# Patient Record
Sex: Female | Born: 1960 | Race: White | Hispanic: No | Marital: Married | Smoking: Never smoker
Health system: Southern US, Community
[De-identification: ages and names within clinical notes are randomized; demographics above are authoritative.]

## PROBLEM LIST (undated history)

## (undated) DIAGNOSIS — O09529 Supervision of elderly multigravida, unspecified trimester: Secondary | ICD-10-CM

## (undated) DIAGNOSIS — K8020 Calculus of gallbladder without cholecystitis without obstruction: Secondary | ICD-10-CM

## (undated) DIAGNOSIS — K219 Gastro-esophageal reflux disease without esophagitis: Secondary | ICD-10-CM

## (undated) HISTORY — DX: Supervision of elderly multigravida, unspecified trimester: O09.529

## (undated) HISTORY — PX: OTHER SURGICAL HISTORY: SHX169

## (undated) HISTORY — DX: Gastro-esophageal reflux disease without esophagitis: K21.9

---

## 1993-04-18 HISTORY — PX: DILATION AND CURETTAGE OF UTERUS: SHX78

## 1996-04-18 DIAGNOSIS — O09529 Supervision of elderly multigravida, unspecified trimester: Secondary | ICD-10-CM

## 1996-04-18 HISTORY — DX: Supervision of elderly multigravida, unspecified trimester: O09.529

## 1997-07-10 ENCOUNTER — Other Ambulatory Visit: Admission: RE | Admit: 1997-07-10 | Discharge: 1997-07-10 | Payer: Self-pay | Admitting: Obstetrics and Gynecology

## 1998-11-19 ENCOUNTER — Other Ambulatory Visit: Admission: RE | Admit: 1998-11-19 | Discharge: 1998-11-19 | Payer: Self-pay | Admitting: Obstetrics and Gynecology

## 1998-12-15 ENCOUNTER — Encounter: Payer: Self-pay | Admitting: Obstetrics and Gynecology

## 1998-12-15 ENCOUNTER — Ambulatory Visit (HOSPITAL_COMMUNITY): Admission: RE | Admit: 1998-12-15 | Discharge: 1998-12-15 | Payer: Self-pay | Admitting: Obstetrics and Gynecology

## 1999-05-12 ENCOUNTER — Inpatient Hospital Stay (HOSPITAL_COMMUNITY): Admission: AD | Admit: 1999-05-12 | Discharge: 1999-05-14 | Payer: Self-pay | Admitting: Obstetrics and Gynecology

## 1999-05-25 ENCOUNTER — Encounter: Admission: RE | Admit: 1999-05-25 | Discharge: 1999-08-23 | Payer: Self-pay | Admitting: Obstetrics and Gynecology

## 1999-10-26 ENCOUNTER — Other Ambulatory Visit: Admission: RE | Admit: 1999-10-26 | Discharge: 1999-10-26 | Payer: Self-pay | Admitting: Obstetrics and Gynecology

## 2000-12-12 ENCOUNTER — Other Ambulatory Visit: Admission: RE | Admit: 2000-12-12 | Discharge: 2000-12-12 | Payer: Self-pay | Admitting: Obstetrics and Gynecology

## 2002-09-11 ENCOUNTER — Other Ambulatory Visit: Admission: RE | Admit: 2002-09-11 | Discharge: 2002-09-11 | Payer: Self-pay | Admitting: Obstetrics and Gynecology

## 2002-12-18 ENCOUNTER — Ambulatory Visit (HOSPITAL_COMMUNITY): Admission: RE | Admit: 2002-12-18 | Discharge: 2002-12-18 | Payer: Self-pay | Admitting: Obstetrics and Gynecology

## 2002-12-18 ENCOUNTER — Encounter: Payer: Self-pay | Admitting: Obstetrics and Gynecology

## 2004-02-03 ENCOUNTER — Ambulatory Visit (HOSPITAL_COMMUNITY): Admission: RE | Admit: 2004-02-03 | Discharge: 2004-02-03 | Payer: Self-pay | Admitting: Obstetrics and Gynecology

## 2004-06-07 ENCOUNTER — Other Ambulatory Visit: Admission: RE | Admit: 2004-06-07 | Discharge: 2004-06-07 | Payer: Self-pay | Admitting: Obstetrics and Gynecology

## 2005-03-17 ENCOUNTER — Ambulatory Visit (HOSPITAL_COMMUNITY): Admission: RE | Admit: 2005-03-17 | Discharge: 2005-03-17 | Payer: Self-pay | Admitting: Obstetrics and Gynecology

## 2005-07-05 ENCOUNTER — Other Ambulatory Visit: Admission: RE | Admit: 2005-07-05 | Discharge: 2005-07-05 | Payer: Self-pay | Admitting: Obstetrics and Gynecology

## 2006-04-05 ENCOUNTER — Ambulatory Visit (HOSPITAL_COMMUNITY): Admission: RE | Admit: 2006-04-05 | Discharge: 2006-04-05 | Payer: Self-pay | Admitting: Obstetrics and Gynecology

## 2007-05-03 ENCOUNTER — Ambulatory Visit (HOSPITAL_COMMUNITY): Admission: RE | Admit: 2007-05-03 | Discharge: 2007-05-03 | Payer: Self-pay | Admitting: Obstetrics and Gynecology

## 2008-05-09 ENCOUNTER — Ambulatory Visit (HOSPITAL_COMMUNITY): Admission: RE | Admit: 2008-05-09 | Discharge: 2008-05-09 | Payer: Self-pay | Admitting: Obstetrics and Gynecology

## 2009-05-13 ENCOUNTER — Ambulatory Visit (HOSPITAL_COMMUNITY): Admission: RE | Admit: 2009-05-13 | Discharge: 2009-05-13 | Payer: Self-pay | Admitting: Obstetrics and Gynecology

## 2010-06-02 ENCOUNTER — Other Ambulatory Visit (HOSPITAL_COMMUNITY): Payer: Self-pay | Admitting: Obstetrics and Gynecology

## 2010-06-02 DIAGNOSIS — Z1231 Encounter for screening mammogram for malignant neoplasm of breast: Secondary | ICD-10-CM

## 2010-06-15 ENCOUNTER — Ambulatory Visit (HOSPITAL_COMMUNITY)
Admission: RE | Admit: 2010-06-15 | Discharge: 2010-06-15 | Disposition: A | Discharge status: Home / Self Care | Payer: BC Managed Care – PPO | Source: Ambulatory Visit | Attending: Obstetrics and Gynecology | Admitting: Obstetrics and Gynecology

## 2010-06-15 ENCOUNTER — Encounter (HOSPITAL_COMMUNITY): Payer: Self-pay

## 2010-06-15 DIAGNOSIS — Z1231 Encounter for screening mammogram for malignant neoplasm of breast: Secondary | ICD-10-CM | POA: Insufficient documentation

## 2010-07-15 ENCOUNTER — Ambulatory Visit (HOSPITAL_BASED_OUTPATIENT_CLINIC_OR_DEPARTMENT_OTHER): Admission: RE | Admit: 2010-07-15 | Payer: BC Managed Care – PPO | Source: Ambulatory Visit | Admitting: Plastic Surgery

## 2010-09-03 NOTE — H&P (Signed)
[facility]  Patient:    Tara Brooks                    MRN: [id number] Adm. Date:  [id number] Attending:  Leonard Schwartz Dictator:   Mack Guise, C.N.M.                         History and Physical  HISTORY OF PRESENT ILLNESS:   Tara Brooks is a 50 year old, Gravida 3, Para 2-0-1-2 at 38-5/7 weeks by dates, [facility] May 21, 1999 who presents for induction of labor due to history of rapid labor with subsequent delivery of her last child at home.  There are no contractions at present. Positive fetal movement. No bleeding.  No fluid leaking from the vagina.  She denies any headache, visual changes or epigastric pain. Her pregnancy has been followed by the [facility] Service t [facility] and is remarkable for:  (1) Advanced material age, declined amniocentesis. (2) History of rapid labor.  (3) Group B Strep negative.  PRENATAL LAB WORK:             On November 19, 1998 hemoglobin and hematocrit 12.1 and 35.9. Platelets 276,000.  Blood type and Rh A positive.  Antibody screen negative. VDRL nonreactive.  Rubella immune. Hepatitis B surface antigen negative.  Pap smear within normal limits.  GC and Chlamydia negative.  On November 19, 1998 AFP/free beta HCG testing within normal range.  On February 17, 1999 at 28 weeks, one hour glucose challenge within normal limits and hemoglobin 11.9.  At 36 weeks culture of the  vaginal tract is negative for group B Strep.  HISTORY OF PRESENT PREGNANCY:                    This patient was initially evaluated at the office of [facility] on November 19, 1998 at approximately [redacted] weeks gestation.  Her pregnancy has een uncomplicated.  The patient declined amniocentesis.  AFP/free beta HCG normal and high resolution ultrasound at 18 weeks normal consistent with dates.  OB HISTORY:                   1995 first trimester SAB with dilatation and curettage.  November 1996 spontaneous vaginal delivery at term with birth of  a  pound 6 ounce female infant with no complications. In July 1998 normal spontaneous vaginal delivery with the birth of a 7 pound 6 ounce female infant, delivered at ome subsequent to rapid labor with no complications in present pregnancy.  MEDICAL HISTORY:              Unremarkable.  FAMILY HISTORY:               Father MI deceased.  Brother with a history of asthma.  Maternal uncle diabetes.  The patients mother has a history of thyroid  disease.  The patients brother has a history of brain cancer.  SURGICAL HISTORY:             Wisdom in 50.  Dilatation and curettage in 1995.  GENETIC HISTORY:              The patient is age 21, otherwise there is no family history of familial or genetic disorders, children that died in infancy or that  were born with birth defects.  MEDICATIONS:                  Prenatal vitamins.  ALLERGIES:                    Ceftin/rash.  Amoxicillin/edema.  HABITS:                       The patient denies the use of tobacco, alcohol or  illicit drugs.  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 She is married to [employer].  He works as an Pensions consultant.  He is involved and supportive.  They are of the [facility] faith.  REVIEW OF SYSTEMS:            There are no signs of symptoms suggestive of focal or systemic disease and the patient is typical of one with a uterine pregnancy at term.  PHYSICAL EXAMINATION:  VITAL SIGNS:                  Stable, afebrile.  HEENT:                        Unremarkable.  LUNGS:                        Clear.  HEART:                        Regular rate and rhythm.  ABDOMEN:                      Gravid in its contour.  Uterine fundus is noted to extend 38 cm above the level of the pubic symphysis.  Leopolds maneuver finds the infant to be in a longitudinal lie, cephalic presentation and the estimated fetal weight is 7 pounds.  Electronic fetal monitoring finds  baseline of the fetal heart rate to be 140 with average long term variability reactivity is present with no  periodic changes.  PELVIC:                       The cervix is found to be 1 cm dilated, 70% effaced with cephalic presenting part at a minus 2 station and membranes intact. There s no pathologic edema.  ASSESSMENT:                   1. Intrauterine pregnancy at term.                               2. Induction of labor for history of rapid labor.  PLAN:                         1. Admit to birthing suite for consult by Dr.                                  Stefano Gaul.                               2. Cytotec 0.25 mcg per vagina for cervical ripening.  The risks and benefits of induction labor/Cytotec                                  were discussed with the patient in detail                                  including the risks of increased cesarean section,                                  hyperstimulation, fetal distress, rupture of the                                  uterus and the subsequent risks to mother and                                  baby from these.  Both patient and husband                                  verbalized their understanding and their wish                                  to continue with induction of labor.  DD: 05/12/99 TD:  05/12/99 Job: [id number]

## 2011-05-11 ENCOUNTER — Other Ambulatory Visit (HOSPITAL_COMMUNITY): Payer: Self-pay | Admitting: Obstetrics and Gynecology

## 2011-05-11 DIAGNOSIS — Z1231 Encounter for screening mammogram for malignant neoplasm of breast: Secondary | ICD-10-CM

## 2011-05-26 ENCOUNTER — Encounter: Payer: Self-pay | Admitting: Internal Medicine

## 2011-06-07 ENCOUNTER — Encounter: Payer: Self-pay | Admitting: Internal Medicine

## 2011-06-07 ENCOUNTER — Ambulatory Visit (AMBULATORY_SURGERY_CENTER): Payer: BC Managed Care – PPO | Admitting: *Deleted

## 2011-06-07 VITALS — Ht 65.0 in | Wt 129.7 lb

## 2011-06-07 DIAGNOSIS — Z1211 Encounter for screening for malignant neoplasm of colon: Secondary | ICD-10-CM

## 2011-06-07 MED ORDER — PEG-KCL-NACL-NASULF-NA ASC-C 100 G PO SOLR: ORAL | Status: DC

## 2011-06-17 ENCOUNTER — Ambulatory Visit (HOSPITAL_COMMUNITY): Payer: BC Managed Care – PPO

## 2011-06-20 ENCOUNTER — Ambulatory Visit (AMBULATORY_SURGERY_CENTER): Payer: BC Managed Care – PPO | Admitting: Internal Medicine

## 2011-06-20 ENCOUNTER — Encounter: Payer: Self-pay | Admitting: Internal Medicine

## 2011-06-20 VITALS — BP 183/83 | HR 82 | Temp 98.9°F | Resp 18 | Ht 65.0 in | Wt 129.0 lb

## 2011-06-20 DIAGNOSIS — Z1211 Encounter for screening for malignant neoplasm of colon: Secondary | ICD-10-CM

## 2011-06-20 MED ORDER — SODIUM CHLORIDE 0.9 % IV SOLN: 500.0000 mL | INTRAVENOUS | Status: DC

## 2011-06-20 NOTE — Progress Notes (Signed)
Patient with preoperative order for IV antibiotic SSI prophylaxis, antibiotic initiated on time. (G8916)  Patient did not experience any of the following events: a burn prior to discharge; a fall within the facility; wrong site/side/patient/procedure/implant event; or a hospital transfer or hospital admission upon discharge from the facility. (G8907)  

## 2011-06-20 NOTE — Patient Instructions (Signed)

## 2011-06-20 NOTE — Op Note (Signed)
[facility] [address] [place], [place]  [address]  COLONOSCOPY PROCEDURE REPORT  PATIENT:  Tara, Brooks  MR#:  [id number] BIRTHDATE:  [birth year], 51 yrs. old  GENDER:  female ENDOSCOPIST:  Hedwig Morton. Juanda Chance, MD REF. BY:  Chilton Greathouse, M.D. PROCEDURE DATE:  06/20/2011 PROCEDURE:  Colonoscopy 19147 ASA CLASS:  Class I INDICATIONS:  colorectal cancer screening, average risk MEDICATIONS:   MAC sedation, administered by CRNA, propofol (Diprivan) 350 mg  DESCRIPTION OF PROCEDURE:   After the risks benefits and alternatives of the procedure were thoroughly explained, informed consent was obtained.  Digital rectal exam was performed and revealed no rectal masses.   The LB [id number]-[id number] endoscope was introduced through the anus and advanced to the cecum, which was identified by both the appendix and ileocecal valve, without limitations.  The quality of the prep was good, using MoviPrep. The instrument was then slowly withdrawn as the colon was fully examined. <<PROCEDUREIMAGES>>  FINDINGS:  No polyps or cancers were seen (see image1, image2, image4, image3, image5, and image6).   Retroflexed views in the rectum revealed no abnormalities.    The time to cecum =  minutes. The scope was then withdrawn in  minutes from the cecum and the procedure completed. COMPLICATIONS:  None ENDOSCOPIC IMPRESSION: 1) No polyps or cancers 2) Normal colonoscopy RECOMMENDATIONS: 1) High fiber diet. REPEAT EXAM:  In 10 year(s) for.  ______________________________ Hedwig Morton. Juanda Chance, MD  CC:  n. eSIGNED:   Hedwig Morton. Enoc Getter at 06/20/2011 12:23 PM  Lisabeth Devoid, [phone]

## 2011-06-21 ENCOUNTER — Telehealth: Payer: Self-pay | Admitting: *Deleted

## 2011-06-21 NOTE — Telephone Encounter (Signed)
  Follow up Call-  Call back number 06/20/2011  Post procedure Call Back phone  # [phone]  Permission to leave phone message Yes     Patient questions:  Do you have a fever, pain , or abdominal swelling? no Pain Score  0 *  Have you tolerated food without any problems? yes  Have you been able to return to your normal activities? yes  Do you have any questions about your discharge instructions: Diet   no Medications  no Follow up visit  no  Do you have questions or concerns about your Care? no  Actions: * If pain score is 4 or above: No action needed, pain <4.

## 2011-07-12 ENCOUNTER — Ambulatory Visit (HOSPITAL_COMMUNITY)
Admission: RE | Admit: 2011-07-12 | Discharge: 2011-07-12 | Disposition: A | Discharge status: Home / Self Care | Payer: BC Managed Care – PPO | Source: Ambulatory Visit | Attending: Obstetrics and Gynecology | Admitting: Obstetrics and Gynecology

## 2011-07-12 DIAGNOSIS — Z1231 Encounter for screening mammogram for malignant neoplasm of breast: Secondary | ICD-10-CM | POA: Insufficient documentation

## 2011-09-29 ENCOUNTER — Encounter: Payer: Self-pay | Admitting: Obstetrics and Gynecology

## 2011-09-29 ENCOUNTER — Ambulatory Visit (INDEPENDENT_AMBULATORY_CARE_PROVIDER_SITE_OTHER): Payer: BC Managed Care – PPO | Admitting: Obstetrics and Gynecology

## 2011-09-29 VITALS — BP 120/70 | Resp 16 | Ht 65.0 in | Wt 131.0 lb

## 2011-09-29 DIAGNOSIS — R8290 Unspecified abnormal findings in urine: Secondary | ICD-10-CM

## 2011-09-29 DIAGNOSIS — R82998 Other abnormal findings in urine: Secondary | ICD-10-CM

## 2011-09-29 DIAGNOSIS — Z124 Encounter for screening for malignant neoplasm of cervix: Secondary | ICD-10-CM

## 2011-09-29 NOTE — Progress Notes (Signed)
Regular Periods: yes Mammogram: yes  Monthly Breast Ex.: yes Exercise: yes  Tetanus < 10 years: yes Seatbelts: yes  NI. Bladder Functn.: yes Abuse at home: no  Daily BM's: no Stressful Work: yes  Healthy Diet: yes Sigmoid-Colonoscopy: 04/2011 WNL   Calcium: yes Medical problems this year: no other concerns    LAST PAP:09/14/2010  Contraception: Jolivette   Mammogram:  06/2011 WNL  PCP: Chilton Greathouse  PMH: no changes    FMH: no changes   Last Bone Scan: n/a   *C/o urine odor x 2-3 months

## 2011-09-30 NOTE — Progress Notes (Signed)
Patient unable to wait for provider--will R/S annual.

## 2011-10-14 ENCOUNTER — Ambulatory Visit (INDEPENDENT_AMBULATORY_CARE_PROVIDER_SITE_OTHER): Payer: BC Managed Care – PPO | Admitting: Obstetrics and Gynecology

## 2011-10-14 ENCOUNTER — Encounter: Payer: Self-pay | Admitting: Obstetrics and Gynecology

## 2011-10-14 VITALS — BP 116/66 | HR 80 | Temp 98.8°F | Ht 65.0 in | Wt 129.0 lb

## 2011-10-14 DIAGNOSIS — N390 Urinary tract infection, site not specified: Secondary | ICD-10-CM

## 2011-10-14 DIAGNOSIS — Z01419 Encounter for gynecological examination (general) (routine) without abnormal findings: Secondary | ICD-10-CM

## 2011-10-14 DIAGNOSIS — R8290 Unspecified abnormal findings in urine: Secondary | ICD-10-CM

## 2011-10-14 DIAGNOSIS — R82998 Other abnormal findings in urine: Secondary | ICD-10-CM

## 2011-10-14 DIAGNOSIS — Z124 Encounter for screening for malignant neoplasm of cervix: Secondary | ICD-10-CM

## 2011-10-14 LAB — POCT URINALYSIS DIPSTICK
Bilirubin, UA: NEGATIVE
Glucose, UA: NEGATIVE
Nitrite, UA: POSITIVE
Spec Grav, UA: 1.02
pH, UA: 5

## 2011-10-14 MED ORDER — LEVONORGEST-ETH ESTRAD 91-DAY 0.15-0.03 MG PO TABS: 1.0000 | ORAL_TABLET | Freq: Every day | ORAL | Status: DC

## 2011-10-14 MED ORDER — LEVONORGEST-ETH ESTRAD 91-DAY 0.15-0.03 MG PO TABS: 1.0000 | ORAL_TABLET | Freq: Every day | ORAL | Status: AC

## 2011-10-14 NOTE — Progress Notes (Signed)
Subjective:    Tara Brooks is a 51 y.o. female, G4P1001, who presents for an annual exam. The patient reports malodorous  urine but no frequency, urgency or dysuria.  Menstrual cycle:   LMP: Patient's last menstrual period was 09/15/2011.            Review of Systems Pertinent items are noted in HPI. Denies pelvic pain, urinary tract symptoms, vaginitis symptoms, irregular bleeding, menopausal symptoms, change in bowel habits or rectal bleeding   Objective:    BP 116/66  Pulse 80  Temp 98.8 F (37.1 C)  Ht 5\' 5"  (1.651 m)  Wt 129 lb (58.514 kg)  BMI 21.47 kg/m2  LMP 09/15/2011   @Weigh @t :  Wt Readings from Last 1 Encounters:  10/14/11 129 lb (58.514 kg)   Body mass index is 21.47 kg/(m^2). General Appearance: Alert, no acute distress HEENT: Grossly normal Neck / Thyroid: Supple, no thyromegaly or cervical adenopathy Lungs: Clear to auscultation bilaterally Back: No CVA tenderness Breast Exam: No masses or nodes.No dimpling, nipple retraction or discharge. Cardiovascular: Regular rate and rhythm.  Gastrointestinal: Soft, non-tender, no masses or organomegaly Pelvic Exam: EGBUS-wnl, vagina-normal rugae, cervix- without lesions or tenderness, uterus appears normal size shape and consistency, adnexae-no masses or tenderness Rectovaginal: no masses and normal sphincter tone Lymphatic Exam: Non-palpable nodes in neck, clavicular,  axillary, or inguinal regions  Skin: no rashes or abnormalities Extremities: no clubbing cyanosis or edema  Neurologic: grossly normal Psychiatric: Alert and oriented    U/A SG 1.020, pH 5.0, nitrite + leuk/blood- trace  Assessment:   Routine GYN Exam Malodorous Urine   Plan:  urine for culture  Renew Jolessa x 1 year  PAP sent  RTO 1 year or prn  Travas Schexnayder,ELMIRAPA-C

## 2011-10-14 NOTE — Progress Notes (Signed)
Last Pap: 09/14/10 WNL: Yes Regular Periods:yes Contraception: Jolivette  Monthly Breast exam:yes Tetanus<66yrs:yes Nl.Bladder Function:yes c/o bad odor from urine x 2-3 months Daily BMs:no Healthy Diet:yes Calcium:yes Mammogram:yes Date of Mammogram: 05/11/2011 Exercise:yes Have often Exercise: occ Seatbelt: yes Abuse at home: no Stressful work:no Sigmoid-colonoscopy: 04/2011-wnl Onward GI Bone Density: No PCP: Dr. Silvano Rusk Change in PMH: none Change in ZOX:WRUE

## 2011-10-17 ENCOUNTER — Telehealth: Payer: Self-pay

## 2011-10-17 LAB — URINE CULTURE: Colony Count: >=100000

## 2011-10-17 NOTE — Telephone Encounter (Signed)
Message copied by Winfred Leeds on Mon Oct 17, 2011  4:12 PM ------      Message from: Henreitta Leber      Created: Mon Oct 17, 2011  3:14 PM       Patient has a positive urine culture and needs to be given Macrobid 100 mg # 14 1 po with food bid x 7 days-no refills.  Thanks,  EP

## 2011-10-17 NOTE — Telephone Encounter (Signed)
TC TO PT TO INFORMED PT OF ABNL URINE CULTURE. TOLD PT THAT I NEED TO CALL IN RX. WILL CALL IN MACROBID 100 MG #14 1 PO WITH FOOD BID X 7 DAYS WITH NO REFILLS TO PT PHARMACY. PT VOICED UNDERSTANDING.

## 2011-10-18 LAB — PAP IG W/ RFLX HPV ASCU

## 2012-06-12 ENCOUNTER — Other Ambulatory Visit: Payer: Self-pay | Admitting: Obstetrics and Gynecology

## 2012-06-12 DIAGNOSIS — Z1231 Encounter for screening mammogram for malignant neoplasm of breast: Secondary | ICD-10-CM

## 2012-07-12 ENCOUNTER — Ambulatory Visit (HOSPITAL_COMMUNITY)
Admission: RE | Admit: 2012-07-12 | Discharge: 2012-07-12 | Disposition: A | Discharge status: Home / Self Care | Payer: BC Managed Care – PPO | Source: Ambulatory Visit | Attending: Obstetrics and Gynecology | Admitting: Obstetrics and Gynecology

## 2012-07-12 DIAGNOSIS — Z1231 Encounter for screening mammogram for malignant neoplasm of breast: Secondary | ICD-10-CM

## 2012-07-13 ENCOUNTER — Other Ambulatory Visit: Payer: Self-pay | Admitting: Obstetrics and Gynecology

## 2012-07-13 DIAGNOSIS — R928 Other abnormal and inconclusive findings on diagnostic imaging of breast: Secondary | ICD-10-CM

## 2012-07-26 ENCOUNTER — Ambulatory Visit
Admission: RE | Admit: 2012-07-26 | Discharge: 2012-07-26 | Disposition: A | Discharge status: Home / Self Care | Payer: BC Managed Care – PPO | Source: Ambulatory Visit | Attending: Obstetrics and Gynecology | Admitting: Obstetrics and Gynecology

## 2012-07-26 DIAGNOSIS — R928 Other abnormal and inconclusive findings on diagnostic imaging of breast: Secondary | ICD-10-CM

## 2013-05-29 ENCOUNTER — Encounter: Payer: Self-pay | Admitting: Physician Assistant

## 2013-06-04 ENCOUNTER — Ambulatory Visit: Payer: BC Managed Care – PPO | Admitting: Physician Assistant

## 2013-06-07 ENCOUNTER — Encounter: Payer: Self-pay | Admitting: Physician Assistant

## 2013-06-07 ENCOUNTER — Ambulatory Visit (INDEPENDENT_AMBULATORY_CARE_PROVIDER_SITE_OTHER): Payer: BC Managed Care – PPO | Admitting: Physician Assistant

## 2013-06-07 VITALS — BP 130/70 | HR 70 | Ht 65.0 in | Wt 133.8 lb

## 2013-06-07 DIAGNOSIS — K219 Gastro-esophageal reflux disease without esophagitis: Secondary | ICD-10-CM

## 2013-06-07 MED ORDER — PANTOPRAZOLE SODIUM 40 MG PO TBEC: DELAYED_RELEASE_TABLET | ORAL | Status: DC

## 2013-06-07 NOTE — Progress Notes (Signed)
Subjective:    Patient ID: Tara Brooks, female    DOB: 1960-07-31, 53 y.o.   MRN: [id number]  HPI Tara Brooks  is a 53 year old white female known to Dr. Delfin Edis from prior screening colonoscopy done in March of 2013. This was a normal exam. She is referred back today by Dr. Dagmar Hait for evaluation of heartburn and reflux symptoms. Patient states that she has had intermittent symptoms over the past one year and will go weeks without any symptoms. She says in the past her symptoms are generally been exacerbated by spicy foods or wine. She says she had an episode last week with heartburn and indigestion which kept her awake for about 4 nights around. She had taken over-the-counter Prilosec in the past periodically and took Prilosec several nights last week. She says she actually took a few doses "proactively", when she knew she was going out to eat etc. and says this worked for her well. This week she has not had any symptoms. She denies any dysphagia or odynophagia. No abnormal pain. Her appetite has been fine and her weight has been stable. She says generally she has always had the most trouble with nighttime symptoms when she has had heartburn.    Review of Systems  Constitutional: Negative.   HENT: Negative.   Eyes: Negative.   Respiratory: Negative.   Cardiovascular: Negative.   Gastrointestinal: Negative.   Endocrine: Negative.   Musculoskeletal: Negative.   Skin: Negative.   Allergic/Immunologic: Negative.   Neurological: Negative.   Hematological: Negative.   Psychiatric/Behavioral: Negative.    Outpatient Prescriptions Prior to Visit  Medication Sig Dispense Refill  . levonorgestrel-ethinyl estradiol (JOLESSA) 0.15-0.03 MG tablet Take 1 tablet by mouth daily.  3 Package  3  . Multiple Vitamin (MULTIVITAMIN) tablet Take 1 tablet by mouth daily.       No facility-administered medications prior to visit.       Allergies  Allergen Reactions  . Ceftin [Cefuroxime Axetil]     . Penicillins     Body aches, red eyes, shaky   Patient Active Problem List   Diagnosis Date Noted  . GERD (gastroesophageal reflux disease) 06/07/2013   History  Substance Use Topics  . Smoking status: Never Smoker   . Smokeless tobacco: Never Used  . Alcohol Use: 1.8 oz/week    3 Glasses of wine per week   family history includes Brain cancer in her brother; Diabetes in her maternal uncle; Heart attack in her father; Thyroid disease in her mother. There is no history of Colon cancer or Stomach cancer.  Objective:   Physical Exam  well-developed white female in no acute distress, pleasant. Blood pressure 130/70 pulse 70 height 5 foot 5 weight 133. HEENT; nontraumatic normocephalic EOMI PERRLA sclera anicteric, Supple; no JVD, Cardiovascular ;regular rate and rhythm with S1-S2 no murmur or gallop, Pulmonary ;clear bilaterally, Abdomen ;soft nontender nondistended bowel sounds are active there is no palpable mass or hepatosplenomegaly, Rectal; exam not done, Extremities; no clubbing cyanosis or edema skin warm and dry, Psych; mood and affect normal and appropriate        Assessment & Plan:  #41  53 year old female with nighttime GERD, no worrisome signs or symptoms  Plan; an antireflux regimen was reviewed with the patient and she was given Scientist, clinical (histocompatibility and immunogenetics) and  An anti-reflux diet Start Protonix 40 mg by mouth daily as needed. Since she has primarily nighttime symptoms she was advised to take the medication before dinner. She will use this  as needed for now and will followup with Dr. Delfin Edis should her symptoms increase in frequency or severity.

## 2013-06-07 NOTE — Progress Notes (Signed)
Reviewed. If she has been having Sx's for 1 years, she ought to have an EGD. Please let  Her know it is my recommendation.

## 2013-06-07 NOTE — Patient Instructions (Signed)
We sent a prescription to [facility] for Protonix ( pantoprazole sodium) 40 mg.  We have given you a savings card to give to the pharmacist. Follow an anti-reflux regimine. Brochure provided.   Follow up with Dr. Delfin Edis as needed.

## 2013-06-12 ENCOUNTER — Telehealth: Payer: Self-pay | Admitting: *Deleted

## 2013-06-12 NOTE — Telephone Encounter (Signed)
Please let pt know Dr. Olevia Perches would like her to have an EGD at some point ----- Message ----- From: Lafayette Dragon, MD Sent: 06/07/2013 1:35 PM To: Amy Genia Harold, PA-C  Spoke with patient and gave her Dr. Nichola Sizer recommendation.

## 2013-06-27 ENCOUNTER — Other Ambulatory Visit: Payer: Self-pay | Admitting: Obstetrics and Gynecology

## 2013-06-27 DIAGNOSIS — Z1231 Encounter for screening mammogram for malignant neoplasm of breast: Secondary | ICD-10-CM

## 2013-07-15 ENCOUNTER — Ambulatory Visit (HOSPITAL_COMMUNITY)
Admission: RE | Admit: 2013-07-15 | Discharge: 2013-07-15 | Disposition: A | Discharge status: Home / Self Care | Payer: BC Managed Care – PPO | Source: Ambulatory Visit | Attending: Obstetrics and Gynecology | Admitting: Obstetrics and Gynecology

## 2013-07-15 ENCOUNTER — Ambulatory Visit (HOSPITAL_COMMUNITY): Payer: BC Managed Care – PPO

## 2013-07-15 DIAGNOSIS — Z1231 Encounter for screening mammogram for malignant neoplasm of breast: Secondary | ICD-10-CM | POA: Insufficient documentation

## 2013-08-05 ENCOUNTER — Telehealth: Payer: Self-pay | Admitting: Physician Assistant

## 2013-08-05 NOTE — Telephone Encounter (Signed)
Spoke with patient and she thought her GERD was better and she declined an EGD but the symptoms have returned. Protonix helps sometimes but not all the time. Usually has problems at night. She is asking to have EGD. Please, advise.

## 2013-08-06 NOTE — Telephone Encounter (Signed)
Ok, that 's fine- please schedule EGD with Dr. Olevia Perches

## 2013-08-06 NOTE — Telephone Encounter (Signed)
Left a message for patient to call me. 

## 2013-08-08 NOTE — Telephone Encounter (Signed)
Patient scheduled for EGD

## 2013-08-19 ENCOUNTER — Encounter: Payer: Self-pay | Admitting: Internal Medicine

## 2013-10-03 ENCOUNTER — Ambulatory Visit (AMBULATORY_SURGERY_CENTER): Payer: BC Managed Care – PPO | Admitting: *Deleted

## 2013-10-03 VITALS — Ht 65.0 in | Wt 131.4 lb

## 2013-10-03 DIAGNOSIS — K219 Gastro-esophageal reflux disease without esophagitis: Secondary | ICD-10-CM

## 2013-10-03 NOTE — Progress Notes (Signed)
No allergies to eggs or soy. No problems with anesthesia.  Pt given Emmi instructions for EGD  No oxygen use  No diet drug use  

## 2013-10-07 ENCOUNTER — Encounter: Payer: Self-pay | Admitting: Internal Medicine

## 2013-10-15 ENCOUNTER — Ambulatory Visit (AMBULATORY_SURGERY_CENTER): Payer: BC Managed Care – PPO | Admitting: Internal Medicine

## 2013-10-15 ENCOUNTER — Encounter: Payer: Self-pay | Admitting: Internal Medicine

## 2013-10-15 VITALS — BP 137/80 | HR 64 | Temp 97.4°F | Resp 20 | Ht 65.0 in | Wt 131.0 lb

## 2013-10-15 DIAGNOSIS — K2970 Gastritis, unspecified, without bleeding: Secondary | ICD-10-CM

## 2013-10-15 DIAGNOSIS — K2990 Gastroduodenitis, unspecified, without bleeding: Secondary | ICD-10-CM

## 2013-10-15 DIAGNOSIS — K2100 Gastro-esophageal reflux disease with esophagitis, without bleeding: Secondary | ICD-10-CM

## 2013-10-15 DIAGNOSIS — K219 Gastro-esophageal reflux disease without esophagitis: Secondary | ICD-10-CM

## 2013-10-15 MED ORDER — SODIUM CHLORIDE 0.9 % IV SOLN: 500.0000 mL | INTRAVENOUS | Status: DC

## 2013-10-15 MED ORDER — METOCLOPRAMIDE HCL 5 MG PO TABS: 5.0000 mg | ORAL_TABLET | Freq: Every evening | ORAL | Status: DC | PRN

## 2013-10-15 NOTE — Progress Notes (Signed)
Patient stable, vss. Report given to sheila, rn

## 2013-10-15 NOTE — Op Note (Signed)
[facility]  [address]. [place], [id number]   ENDOSCOPY PROCEDURE REPORT  PATIENT: Tara Brooks, Tara Brooks  MR#: [id number] BIRTHDATE: [birth year] , 53  yrs. old GENDER: Female ENDOSCOPIST: Lafayette Dragon, MD REFERRED BY:  Prince Solian, M.D. PROCEDURE DATE:  10/15/2013 PROCEDURE:  EGD w/ biopsy ASA CLASS:     Class II INDICATIONS:  Heartburn.   Chest pain.   History of esophageal reflux.   gastroesophageal reflux refractory to PPI.  Currently on Protonix 40 mg daily,. MEDICATIONS: MAC sedation, administered by CRNA and propofol (Diprivan) 200mg  IV TOPICAL ANESTHETIC: Cetacaine Spray  DESCRIPTION OF PROCEDURE: After the risks benefits and alternatives of the procedure were thoroughly explained, informed consent was obtained.  The LB [id number] endoscope was introduced through the mouth and advanced to the second portion of the duodenum. Without limitations.  The instrument was slowly withdrawn as the mucosa was fully examined.      Esophagus: proximal mid and distal esophageal mucosa was normal. There was no esophagitis or stricture. The squamocolumnar junction was slightly irregular. Multiple biopsies were obtained to rule out Barrett's esophagus. Distal to the Z line was a small reducible hiatal hernia which measured 2 cm Stomach: the gastric mucosa was diffusely erythematous without focal lesions such as erosions or ulcerations. The rugal pattern was normal. Gastric outlet was unremarkable. Retroflexion of the endoscope revealed normal fundus and cardia. Biopsies were taken from the gastric antrum to rule out H. pylori Duodenum: duodenal bulb and descending duodenum was normal[ The scope was then withdrawn from the patient and the procedure completed.  COMPLICATIONS: There were no complications. ENDOSCOPIC IMPRESSION:  essentially normal upper endoscopy of esophagus stomach and duodenum. Slightly irregular squamocolumnar junction. Status  post biopsies Small reducible 2 cm hiatal hernia Mild nonspecific gastritis. Status post biopsies to rule out H. pylori  RECOMMENDATIONS: 1.  Await pathology results 2.  Anti-reflux regimen to be follow 3.  Increase protonix 40 mg by mouth twice a day Add Reglan 5 mg at bedtime  REPEAT EXAM: for EGD pending biopsy results.  eSigned:  Lafayette Dragon, MD 10/15/2013 10:24 AM   CC:  PATIENT NAME:  Tara Brooks, Tara Brooks MR#: [id number]

## 2013-10-15 NOTE — Progress Notes (Signed)
Called to room to assist during endoscopic procedure.  Patient ID and intended procedure confirmed with present staff. Received instructions for my participation in the procedure from the performing physician.  

## 2013-10-15 NOTE — Patient Instructions (Addendum)

## 2013-10-16 ENCOUNTER — Telehealth: Payer: Self-pay | Admitting: *Deleted

## 2013-10-16 NOTE — Telephone Encounter (Signed)
  Follow up Call-  Call back number 10/15/2013 06/20/2011  Post procedure Call Back phone  # [phone]  Permission to leave phone message Yes Yes     Patient questions:  Left message to call if necessary.

## 2013-10-24 ENCOUNTER — Encounter: Payer: Self-pay | Admitting: Internal Medicine

## 2013-10-24 ENCOUNTER — Encounter: Payer: Self-pay | Admitting: *Deleted

## 2014-01-29 ENCOUNTER — Other Ambulatory Visit: Payer: Self-pay | Admitting: Dermatology

## 2014-02-17 ENCOUNTER — Encounter: Payer: Self-pay | Admitting: Internal Medicine

## 2014-06-16 ENCOUNTER — Other Ambulatory Visit (HOSPITAL_COMMUNITY): Payer: Self-pay | Admitting: Obstetrics and Gynecology

## 2014-06-16 DIAGNOSIS — Z1231 Encounter for screening mammogram for malignant neoplasm of breast: Secondary | ICD-10-CM

## 2014-06-23 ENCOUNTER — Other Ambulatory Visit: Payer: Self-pay | Admitting: Physician Assistant

## 2014-07-22 ENCOUNTER — Ambulatory Visit (HOSPITAL_COMMUNITY)
Admission: RE | Admit: 2014-07-22 | Discharge: 2014-07-22 | Disposition: A | Discharge status: Home / Self Care | Payer: BLUE CROSS/BLUE SHIELD | Source: Ambulatory Visit | Attending: Obstetrics and Gynecology | Admitting: Obstetrics and Gynecology

## 2014-07-22 ENCOUNTER — Other Ambulatory Visit (HOSPITAL_COMMUNITY): Payer: Self-pay | Admitting: Obstetrics and Gynecology

## 2014-07-22 DIAGNOSIS — Z1231 Encounter for screening mammogram for malignant neoplasm of breast: Secondary | ICD-10-CM

## 2014-10-24 ENCOUNTER — Telehealth: Payer: Self-pay | Admitting: Internal Medicine

## 2014-10-24 NOTE — Telephone Encounter (Signed)
Patient states that she has been having problems with acid reflux for the last few weeks. She states she is taking Protonix once a day and she has not been taking Reglan. Per procedure report in 2015, she was to increase Protonix to BID and take Reglan daily also. She does not remember ever doing either of these. Please, advise.

## 2014-10-29 NOTE — Telephone Encounter (Signed)
Increase Protonix to 40 mg po bid, #60, 3 refills, hold off on Reglan. OV please, first available,

## 2014-10-29 NOTE — Telephone Encounter (Signed)
Pt is calling back about her Protonix med

## 2014-10-30 MED ORDER — PANTOPRAZOLE SODIUM 40 MG PO TBEC: 40.0000 mg | DELAYED_RELEASE_TABLET | Freq: Two times a day (BID) | ORAL | Status: DC

## 2014-10-30 NOTE — Telephone Encounter (Signed)
Patient given recommendations. Rx sent. Scheduled OV on 11/18/14 at 1:00 PM with Dr. Olevia Perches.

## 2014-11-18 ENCOUNTER — Ambulatory Visit (INDEPENDENT_AMBULATORY_CARE_PROVIDER_SITE_OTHER): Payer: BLUE CROSS/BLUE SHIELD | Admitting: Internal Medicine

## 2014-11-18 ENCOUNTER — Encounter: Payer: Self-pay | Admitting: Internal Medicine

## 2014-11-18 VITALS — BP 108/62 | HR 70 | Ht 65.0 in | Wt 130.0 lb

## 2014-11-18 DIAGNOSIS — K219 Gastro-esophageal reflux disease without esophagitis: Secondary | ICD-10-CM | POA: Diagnosis not present

## 2014-11-18 DIAGNOSIS — R109 Unspecified abdominal pain: Secondary | ICD-10-CM

## 2014-11-18 MED ORDER — RANITIDINE HCL 150 MG PO TABS: 150.0000 mg | ORAL_TABLET | Freq: Every day | ORAL | Status: AC

## 2014-11-18 MED ORDER — PANTOPRAZOLE SODIUM 40 MG PO TBEC: 40.0000 mg | DELAYED_RELEASE_TABLET | Freq: Two times a day (BID) | ORAL | Status: AC

## 2014-11-18 NOTE — Patient Instructions (Addendum)
We have sent  medications to your pharmacy for you to pick up at your convenience. You have been scheduled for an abdominal ultrasound at [facility] (1st floor of hospital) on 11/21/14 at 830 am. Please arrive 15 minutes prior to your appointment for registration. Make certain not to have anything to eat or drink 6 hours prior to your appointment. Should you need to reschedule your appointment, please contact radiology at [phone]. This test typically takes about 30 minutes to perform.  Dr Berneta Sages

## 2014-11-18 NOTE — Progress Notes (Signed)
MARYLAND STELL 1960-10-06 [id number]  Note: This dictation was prepared with Dragon digital system. Any transcriptional errors that result from this procedure are unintentional.   History of Present Illness: This is a   54 year old white female with history of gastroesophageal reflux disease seen on February 2015 and started on Protonix 40 mg twice a day. She has been only taking it once a day. She has never taken Reglan. Her symptoms have been generally improved but the she is still has breakthrough symptoms which wake her up at 2 in the morning and radiate burning pain around to her back. There is no family history of gallbladder disease. Upper endoscopy in June 2015 showed 2 cm hiatal hernia. Biopsies showed no evidence of Barrett's esophagus. There was mild H. Pylori negative gastritis. Colonoscopy in March 2013 was normal    Past Medical History  Diagnosis Date  . AMA (advanced maternal age) multigravida 104+ 57  . GERD (gastroesophageal reflux disease)     Past Surgical History  Procedure Laterality Date  . Bilateral bunionectomy  [id number]  . Dilation and curettage of uterus  1995    Allergies  Allergen Reactions  . Ceftin [Cefuroxime Axetil]     Per pt: unknown  . Penicillins     Body aches, red eyes, shaky    Family history and social history have been reviewed.  Review of Systems:   The remainder of the 10 point ROS is negative except as outlined in the H&P  Physical Exam: General Appearance Well developed, in no distress Eyes  Non icteric  HEENT  Non traumatic, normocephalic  Mouth No lesion, tongue papillated, no cheilosis Neck Supple without adenopathy, thyroid not enlarged, no carotid bruits, no JVD Lungs Clear to auscultation bilaterally COR Normal S1, normal S2, regular rhythm, no murmur, quiet precordium Abdomen  Soft, nontender, normoactive bowel sounds. Rectal  Not done Extremities  No pedal edema Skin No lesions Neurological Alert and oriented  x 3 Psychological Normal mood and affect  Assessment and Plan:    54 year old white female with gastroesophageal reflux disease with breakthrough symptoms. She will start ranitidine 150 mg in the morning and Protonix 40 mg at bedtime. Antireflux measures. Upper abdominal ultrasound to rule out symptomatic gallbladder disease   Colorectal screening. Recall colonoscopy March 2023     Delfin Edis 11/18/2014

## 2014-11-21 ENCOUNTER — Ambulatory Visit (HOSPITAL_COMMUNITY)
Admission: RE | Admit: 2014-11-21 | Discharge: 2014-11-21 | Disposition: A | Discharge status: Home / Self Care | Payer: BLUE CROSS/BLUE SHIELD | Source: Ambulatory Visit | Attending: Internal Medicine | Admitting: Internal Medicine

## 2014-11-21 ENCOUNTER — Other Ambulatory Visit: Payer: Self-pay | Admitting: *Deleted

## 2014-11-21 DIAGNOSIS — R109 Unspecified abdominal pain: Secondary | ICD-10-CM

## 2014-11-21 DIAGNOSIS — R1084 Generalized abdominal pain: Secondary | ICD-10-CM | POA: Diagnosis present

## 2014-11-21 DIAGNOSIS — K219 Gastro-esophageal reflux disease without esophagitis: Secondary | ICD-10-CM

## 2014-11-21 DIAGNOSIS — K8020 Calculus of gallbladder without cholecystitis without obstruction: Secondary | ICD-10-CM | POA: Insufficient documentation

## 2014-12-05 ENCOUNTER — Ambulatory Visit (HOSPITAL_COMMUNITY)
Admission: RE | Admit: 2014-12-05 | Discharge: 2014-12-05 | Disposition: A | Discharge status: Home / Self Care | Payer: BLUE CROSS/BLUE SHIELD | Source: Ambulatory Visit | Attending: Internal Medicine | Admitting: Internal Medicine

## 2014-12-05 DIAGNOSIS — R112 Nausea with vomiting, unspecified: Secondary | ICD-10-CM | POA: Diagnosis not present

## 2014-12-05 DIAGNOSIS — K8020 Calculus of gallbladder without cholecystitis without obstruction: Secondary | ICD-10-CM | POA: Insufficient documentation

## 2014-12-05 DIAGNOSIS — R1011 Right upper quadrant pain: Secondary | ICD-10-CM | POA: Diagnosis not present

## 2014-12-05 MED ORDER — TECHNETIUM TC 99M MEBROFENIN IV KIT
5.3000 | PACK | Freq: Once | INTRAVENOUS | Status: DC | PRN
  Administered 2014-12-05: 5 via INTRAVENOUS
  Filled 2014-12-05: qty 6

## 2014-12-05 MED ORDER — SINCALIDE 5 MCG IJ SOLR: 0.0200 ug/kg | Freq: Once | INTRAMUSCULAR | Status: DC

## 2014-12-09 ENCOUNTER — Telehealth: Payer: Self-pay | Admitting: Internal Medicine

## 2014-12-09 NOTE — Telephone Encounter (Signed)
Scheduled at [facility] with Dr. Dalbert Batman on 12/16/14 at 2:15 PM. Patient notified.

## 2014-12-16 ENCOUNTER — Other Ambulatory Visit: Payer: Self-pay | Admitting: General Surgery

## 2015-01-14 ENCOUNTER — Encounter (HOSPITAL_BASED_OUTPATIENT_CLINIC_OR_DEPARTMENT_OTHER): Payer: Self-pay | Admitting: *Deleted

## 2015-01-15 ENCOUNTER — Encounter (HOSPITAL_BASED_OUTPATIENT_CLINIC_OR_DEPARTMENT_OTHER)
Admission: RE | Admit: 2015-01-15 | Discharge: 2015-01-15 | Disposition: A | Discharge status: Home / Self Care | Payer: BLUE CROSS/BLUE SHIELD | Source: Ambulatory Visit | Attending: General Surgery | Admitting: General Surgery

## 2015-01-15 DIAGNOSIS — K8010 Calculus of gallbladder with chronic cholecystitis without obstruction: Secondary | ICD-10-CM | POA: Diagnosis not present

## 2015-01-15 DIAGNOSIS — Z79899 Other long term (current) drug therapy: Secondary | ICD-10-CM | POA: Diagnosis not present

## 2015-01-15 DIAGNOSIS — K219 Gastro-esophageal reflux disease without esophagitis: Secondary | ICD-10-CM | POA: Diagnosis not present

## 2015-01-15 LAB — CBC WITH DIFFERENTIAL/PLATELET
Basophils Absolute: 0 10*3/uL (ref 0.0–0.1)
Basophils Relative: 0 %
EOS ABS: 0.1 10*3/uL (ref 0.0–0.7)
EOS PCT: 1 %
HCT: 37.4 % (ref 36.0–46.0)
Hemoglobin: 12.2 g/dL (ref 12.0–15.0)
LYMPHS ABS: 1.4 10*3/uL (ref 0.7–4.0)
LYMPHS PCT: 31 %
MCH: 28.4 pg (ref 26.0–34.0)
MCHC: 32.6 g/dL (ref 30.0–36.0)
MCV: 87.2 fL (ref 78.0–100.0)
MONO ABS: 0.3 10*3/uL (ref 0.1–1.0)
Monocytes Relative: 7 %
Neutro Abs: 2.8 10*3/uL (ref 1.7–7.7)
Neutrophils Relative %: 61 %
PLATELETS: 259 10*3/uL (ref 150–400)
RBC: 4.29 MIL/uL (ref 3.87–5.11)
RDW: 12.4 % (ref 11.5–15.5)
WBC: 4.6 10*3/uL (ref 4.0–10.5)

## 2015-01-15 LAB — COMPREHENSIVE METABOLIC PANEL
ALT: 14 U/L (ref 14–54)
ANION GAP: 7 (ref 5–15)
AST: 20 U/L (ref 15–41)
Albumin: 4 g/dL (ref 3.5–5.0)
Alkaline Phosphatase: 35 U/L — ABNORMAL LOW (ref 38–126)
BUN: 16 mg/dL (ref 6–20)
CHLORIDE: 103 mmol/L (ref 101–111)
CO2: 25 mmol/L (ref 22–32)
CREATININE: 0.81 mg/dL (ref 0.44–1.00)
Calcium: 9.2 mg/dL (ref 8.9–10.3)
Glucose, Bld: 107 mg/dL — ABNORMAL HIGH (ref 65–99)
POTASSIUM: 3.7 mmol/L (ref 3.5–5.1)
SODIUM: 135 mmol/L (ref 135–145)
Total Bilirubin: 0.5 mg/dL (ref 0.3–1.2)
Total Protein: 7.2 g/dL (ref 6.5–8.1)

## 2015-01-18 NOTE — H&P (Signed)
Tara Brooks  Location: [facility] Patient #: [id number] DOB: [birth year] Married / Language: English / Race: White Female        History of Present Illness  . This is a very pleasant, very healthy 54 year old Caucasian female, referred by Dr. Delfin Edis evaluation and management of symptomatic gallstones. Dr. Dagmar Hait is her PCP. Dr. Kendall Flack is her gynecologist. She is personal friends with Bjorn Loser. The patient gives an 18 month history of intermittent episodes of epigastric and back pain. She says that the pattern is that if she overeats or eats the wrong foods she will wake up in the middle of the night with epigastric pain and back pain. She has had some episodes of nausea and vomiting associated with this. No diarrhea. These attacks generally occur at least once a month and will sometimes occur as a cluster. She's been taking Protonix and Zantac for a long time because of presumed GERD. She continues to take that. No attacks in the past 2 weeks. Her diet is fairly low low-fat, mostly vegetables chicken and fish but does use but her sometimes. Comorbidities are minimal. Upper endoscopy in June 2015 showing a small 2 cm hiatal hernia. Colonoscopy March 2013 normal. No history of liver, cardiovascular, pulmonary, or urologic disease. No history of abdominal surgery. Presumed GERD. She will be scheduled for laparoscopic cholecystectomy with cholangiogram, possible open. I discussed the indications, details, techniques, and numerous risk of the surgery with her. She is aware of the risk of bleeding, infection, conversion to open laparotomy, bile leak with readmission of the hospital, wound hernia, injury to adjacent organs such as the bile duct or intestine with major reconstructive surgery, nerve damage with chronic pain, cardiac, pulmonary and thromboembolic problems. I reviewed the patient information booklet with her in detail. All of her  questions are answered. She understands all of these issues. She agrees with this plan.   Other Problems Cholelithiasis Gastroesophageal Reflux Disease  Past Surgical History Foot Surgery Bilateral.  Diagnostic Studies History Colonoscopy 1-5 years ago Mammogram within last year Pap Smear 1-5 years ago  Allergies Elbert Ewings, CMA; 12/16/2014 2:42 PM) Penicillin V Potassium *PENICILLINS*  Medication History  Pantoprazole Sodium (40MG  Tablet DR, Oral) Active. Jolessa (0.15-0.03MG  Tablet, Oral) Active. Zantac (150MG  Tablet, Oral) Active. Multiple Vitamin (Oral) Active. Medications Reconciled  Social History  Alcohol use Occasional alcohol use. No caffeine use No drug use Tobacco use Never smoker.  Family History  Cancer Brother. Depression Son. Heart disease in female family member before age 55 Thyroid problems Mother.  Pregnancy / Birth History  Age at menarche 81 years. Contraceptive History Oral contraceptives. Gravida 4 Maternal age 59-35 Para 3 Regular periods  Review of Systems  General Not Present- Appetite Loss, Chills, Fatigue, Fever, Night Sweats, Weight Gain and Weight Loss. Skin Not Present- Change in Wart/Mole, Dryness, Hives, Jaundice, New Lesions, Non-Healing Wounds, Rash and Ulcer. HEENT Present- Wears glasses/contact lenses. Not Present- Earache, Hearing Loss, Hoarseness, Nose Bleed, Oral Ulcers, Ringing in the Ears, Seasonal Allergies, Sinus Pain, Sore Throat, Visual Disturbances and Yellow Eyes. Respiratory Not Present- Bloody sputum, Chronic Cough, Difficulty Breathing, Snoring and Wheezing. Breast Not Present- Breast Mass, Breast Pain, Nipple Discharge and Skin Changes. Cardiovascular Not Present- Chest Pain, Difficulty Breathing Lying Down, Leg Cramps, Palpitations, Rapid Heart Rate, Shortness of Breath and Swelling of Extremities. Gastrointestinal Present- Bloating, Indigestion and Vomiting. Not Present- Abdominal  Pain, Bloody Stool, Change in Bowel Habits, Chronic diarrhea, Constipation, Difficulty Swallowing, Excessive gas, Gets full quickly  at meals, Hemorrhoids, Nausea and Rectal Pain. Female Genitourinary Not Present- Frequency, Nocturia, Painful Urination, Pelvic Pain and Urgency. Musculoskeletal Not Present- Back Pain, Joint Pain, Joint Stiffness, Muscle Pain, Muscle Weakness and Swelling of Extremities. Neurological Not Present- Decreased Memory, Fainting, Headaches, Numbness, Seizures, Tingling, Tremor, Trouble walking and Weakness. Psychiatric Not Present- Anxiety, Bipolar, Change in Sleep Pattern, Depression, Fearful and Frequent crying. Endocrine Not Present- Cold Intolerance, Excessive Hunger, Hair Changes, Heat Intolerance, Hot flashes and New Diabetes. Hematology Not Present- Easy Bruising, Excessive bleeding, Gland problems, HIV and Persistent Infections.   Vitals  Weight: 130 lb Height: 65in Body Surface Area: 1.64 m Body Mass Index: 21.63 kg/m Temp.: 97.64F(Temporal)  Pulse: 71 (Regular)  BP: 122/62 (Sitting, Left Arm, Standard)    Physical Exam  General Mental Status-Alert. General Appearance-Consistent with stated age. Hydration-Well hydrated. Voice-Normal.  Head and Neck Head-normocephalic, atraumatic with no lesions or palpable masses. Trachea-midline. Thyroid Gland Characteristics - normal size and consistency.  Eye Eyeball - Bilateral-Extraocular movements intact. Sclera/Conjunctiva - Bilateral-No scleral icterus.  Chest and Lung Exam Chest and lung exam reveals -quiet, even and easy respiratory effort with no use of accessory muscles and on auscultation, normal breath sounds, no adventitious sounds and normal vocal resonance. Inspection Chest Wall - Normal. Back - normal.  Cardiovascular Cardiovascular examination reveals -normal heart sounds, regular rate and rhythm with no murmurs and normal pedal pulses  bilaterally.  Abdomen Inspection Inspection of the abdomen reveals - No Hernias. Skin - Scar - no surgical scars. Palpation/Percussion Palpation and Percussion of the abdomen reveal - Soft, Non Tender, No Rebound tenderness, No Rigidity (guarding) and No hepatosplenomegaly. Auscultation Auscultation of the abdomen reveals - Bowel sounds normal.  Neurologic Neurologic evaluation reveals -alert and oriented x 3 with no impairment of recent or remote memory. Mental Status-Normal.  Musculoskeletal Normal Exam - Left-Upper Extremity Strength Normal and Lower Extremity Strength Normal. Normal Exam - Right-Upper Extremity Strength Normal and Lower Extremity Strength Normal.  Lymphatic Head & Neck  General Head & Neck Lymphatics: Bilateral - Description - Normal. Axillary  General Axillary Region: Bilateral - Description - Normal. Tenderness - Non Tender. Femoral & Inguinal  Generalized Femoral & Inguinal Lymphatics: Bilateral - Description - Normal. Tenderness - Non Tender.    Assessment & Plan  GALLSTONES (574.20  K80.20) Current Plans  You are being scheduled for surgery - Our schedulers will call you. Both your ultrasound and your biliary scan are abnormal, strongly suggesting gallbladder disease secondary to gallstones. Your episodes of upper abdominal and back pain with nausea are fairly typical for gallbladder attacks. These attacks will likely continue until something is done You'll be scheduled for laparoscopic cholecystectomy with cholangiogram, possible open cholecystectomy in the near future at your convenience Continue to take the Protonix and Zantac Follow a very low-fat diet to avoid further attacks We discussed the indications, techniques, and risks of this surgery in detail Please read the printed information that I gave unit and the pamphlet.      You should hear from our office's scheduling department within 5 working days about the location, date,  and time of surgery. We try to make accommodations for patient's preferences in scheduling surgery, but sometimes the OR schedule or the surgeon's schedule prevents [place] from making those accommodations.  If you have not heard from our office [phone] in 5 working days, call the office and ask for your surgeon's nurse.  If you have other questions about your diagnosis, plan, or surgery, call the office and ask  for your surgeon's nurse.  Pt Education - Laparoscopic Cholecystectomy: gallbladder  Pt Education - [facility] Laparosopic Post Op HCI (Gross) CHRONIC GERD (530.81  K21.9)   Hadley Detloff M. Dalbert Batman, M.D., [facility] General and Minimally invasive Surgery Breast and Colorectal Surgery Office:   [phone] Pager:   [phone]

## 2015-01-20 ENCOUNTER — Encounter (HOSPITAL_BASED_OUTPATIENT_CLINIC_OR_DEPARTMENT_OTHER): Payer: Self-pay | Admitting: *Deleted

## 2015-01-20 ENCOUNTER — Ambulatory Visit (HOSPITAL_BASED_OUTPATIENT_CLINIC_OR_DEPARTMENT_OTHER): Payer: BLUE CROSS/BLUE SHIELD | Admitting: Anesthesiology

## 2015-01-20 ENCOUNTER — Ambulatory Visit (HOSPITAL_BASED_OUTPATIENT_CLINIC_OR_DEPARTMENT_OTHER)
Admission: RE | Admit: 2015-01-20 | Discharge: 2015-01-20 | Disposition: A | Discharge status: Home / Self Care | Payer: BLUE CROSS/BLUE SHIELD | Source: Ambulatory Visit | Attending: General Surgery | Admitting: General Surgery

## 2015-01-20 ENCOUNTER — Encounter (HOSPITAL_BASED_OUTPATIENT_CLINIC_OR_DEPARTMENT_OTHER)
Admission: RE | Disposition: A | Discharge status: Home / Self Care | Payer: Self-pay | Source: Ambulatory Visit | Attending: General Surgery

## 2015-01-20 DIAGNOSIS — K8010 Calculus of gallbladder with chronic cholecystitis without obstruction: Secondary | ICD-10-CM | POA: Insufficient documentation

## 2015-01-20 DIAGNOSIS — K8020 Calculus of gallbladder without cholecystitis without obstruction: Secondary | ICD-10-CM

## 2015-01-20 DIAGNOSIS — Z79899 Other long term (current) drug therapy: Secondary | ICD-10-CM | POA: Insufficient documentation

## 2015-01-20 DIAGNOSIS — K219 Gastro-esophageal reflux disease without esophagitis: Secondary | ICD-10-CM | POA: Insufficient documentation

## 2015-01-20 HISTORY — PX: CHOLECYSTECTOMY: SHX55

## 2015-01-20 HISTORY — DX: Calculus of gallbladder without cholecystitis without obstruction: K80.20

## 2015-01-20 SURGERY — LAPAROSCOPIC CHOLECYSTECTOMY
Anesthesia: General | Site: Abdomen

## 2015-01-20 MED ORDER — PROMETHAZINE HCL 12.5 MG PO TABS: 12.5000 mg | ORAL_TABLET | Freq: Once | ORAL | Status: AC

## 2015-01-20 MED ORDER — MIDAZOLAM HCL 2 MG/2ML IJ SOLN
1.0000 mg | INTRAMUSCULAR | Status: DC | PRN
  Administered 2015-01-20: 2 mg via INTRAVENOUS

## 2015-01-20 MED ORDER — CHLORHEXIDINE GLUCONATE 4 % EX LIQD: 1.0000 "application " | Freq: Once | CUTANEOUS | Status: DC

## 2015-01-20 MED ORDER — ROCURONIUM BROMIDE 100 MG/10ML IV SOLN
INTRAVENOUS | Status: DC | PRN
  Administered 2015-01-20: 30 mg via INTRAVENOUS

## 2015-01-20 MED ORDER — CIPROFLOXACIN IN D5W 400 MG/200ML IV SOLN
INTRAVENOUS | Status: AC
  Filled 2015-01-20: qty 200

## 2015-01-20 MED ORDER — FENTANYL CITRATE (PF) 100 MCG/2ML IJ SOLN: 25.0000 ug | INTRAMUSCULAR | Status: DC | PRN

## 2015-01-20 MED ORDER — FENTANYL CITRATE (PF) 100 MCG/2ML IJ SOLN
50.0000 ug | INTRAMUSCULAR | Status: DC | PRN
  Administered 2015-01-20: 100 ug via INTRAVENOUS
  Administered 2015-01-20: 50 ug via INTRAVENOUS

## 2015-01-20 MED ORDER — ONDANSETRON HCL 4 MG/2ML IJ SOLN
INTRAMUSCULAR | Status: DC | PRN
  Administered 2015-01-20: 4 mg via INTRAVENOUS

## 2015-01-20 MED ORDER — LACTATED RINGERS IV SOLN
INTRAVENOUS | Status: DC
  Administered 2015-01-20 (×2): via INTRAVENOUS

## 2015-01-20 MED ORDER — OXYCODONE HCL 5 MG PO TABS
ORAL_TABLET | ORAL | Status: AC
  Filled 2015-01-20: qty 1

## 2015-01-20 MED ORDER — LIDOCAINE HCL (CARDIAC) 10 MG/ML IV SOLN
INTRAVENOUS | Status: DC | PRN
  Administered 2015-01-20: 60 mg via INTRAVENOUS

## 2015-01-20 MED ORDER — ACETAMINOPHEN 650 MG RE SUPP: 650.0000 mg | RECTAL | Status: DC | PRN

## 2015-01-20 MED ORDER — FENTANYL CITRATE (PF) 100 MCG/2ML IJ SOLN
INTRAMUSCULAR | Status: AC
  Filled 2015-01-20: qty 4

## 2015-01-20 MED ORDER — OXYCODONE HCL 5 MG/5ML PO SOLN: 5.0000 mg | Freq: Once | ORAL | Status: AC | PRN

## 2015-01-20 MED ORDER — OXYCODONE HCL 5 MG PO TABS
5.0000 mg | ORAL_TABLET | Freq: Once | ORAL | Status: AC | PRN
  Administered 2015-01-20: 5 mg via ORAL

## 2015-01-20 MED ORDER — HYDROCODONE-ACETAMINOPHEN 5-325 MG PO TABS: 1.0000 | ORAL_TABLET | Freq: Four times a day (QID) | ORAL | Status: AC | PRN

## 2015-01-20 MED ORDER — ROCURONIUM BROMIDE 50 MG/5ML IV SOLN
INTRAVENOUS | Status: AC
  Filled 2015-01-20: qty 1

## 2015-01-20 MED ORDER — GLYCOPYRROLATE 0.2 MG/ML IJ SOLN
INTRAMUSCULAR | Status: AC
  Filled 2015-01-20: qty 1

## 2015-01-20 MED ORDER — BUPIVACAINE-EPINEPHRINE (PF) 0.25% -1:200000 IJ SOLN
INTRAMUSCULAR | Status: AC
  Filled 2015-01-20: qty 30

## 2015-01-20 MED ORDER — PROPOFOL 10 MG/ML IV BOLUS
INTRAVENOUS | Status: AC
  Filled 2015-01-20: qty 20

## 2015-01-20 MED ORDER — LIDOCAINE HCL (CARDIAC) 20 MG/ML IV SOLN
INTRAVENOUS | Status: AC
  Filled 2015-01-20: qty 5

## 2015-01-20 MED ORDER — DEXAMETHASONE SODIUM PHOSPHATE 4 MG/ML IJ SOLN
INTRAMUSCULAR | Status: DC | PRN
  Administered 2015-01-20: 10 mg via INTRAVENOUS

## 2015-01-20 MED ORDER — SODIUM CHLORIDE 0.9 % IJ SOLN: 3.0000 mL | Freq: Two times a day (BID) | INTRAMUSCULAR | Status: DC

## 2015-01-20 MED ORDER — PROMETHAZINE HCL 25 MG/ML IJ SOLN: 6.2500 mg | INTRAMUSCULAR | Status: DC | PRN

## 2015-01-20 MED ORDER — NEOSTIGMINE METHYLSULFATE 10 MG/10ML IV SOLN
INTRAVENOUS | Status: AC
  Filled 2015-01-20: qty 1

## 2015-01-20 MED ORDER — ONDANSETRON HCL 4 MG/2ML IJ SOLN
INTRAMUSCULAR | Status: AC
  Filled 2015-01-20: qty 2

## 2015-01-20 MED ORDER — DEXAMETHASONE SODIUM PHOSPHATE 10 MG/ML IJ SOLN
INTRAMUSCULAR | Status: AC
  Filled 2015-01-20: qty 1

## 2015-01-20 MED ORDER — SODIUM CHLORIDE 0.9 % IR SOLN
Status: DC | PRN
  Administered 2015-01-20: 1

## 2015-01-20 MED ORDER — PROMETHAZINE HCL 12.5 MG PO TABS
12.5000 mg | ORAL_TABLET | Freq: Once | ORAL | Status: AC | PRN
  Administered 2015-01-20: 12.5 mg via ORAL
  Filled 2015-01-20: qty 1

## 2015-01-20 MED ORDER — SUCCINYLCHOLINE CHLORIDE 20 MG/ML IJ SOLN
INTRAMUSCULAR | Status: AC
  Filled 2015-01-20: qty 1

## 2015-01-20 MED ORDER — FENTANYL CITRATE (PF) 100 MCG/2ML IJ SOLN
INTRAMUSCULAR | Status: AC
  Filled 2015-01-20: qty 2

## 2015-01-20 MED ORDER — PROPOFOL 10 MG/ML IV BOLUS
INTRAVENOUS | Status: DC | PRN
  Administered 2015-01-20: 200 mg via INTRAVENOUS

## 2015-01-20 MED ORDER — GLYCOPYRROLATE 0.2 MG/ML IJ SOLN
INTRAMUSCULAR | Status: AC
  Filled 2015-01-20: qty 2

## 2015-01-20 MED ORDER — SODIUM CHLORIDE 0.9 % IV SOLN: INTRAVENOUS | Status: DC

## 2015-01-20 MED ORDER — SODIUM CHLORIDE 0.9 % IJ SOLN: 3.0000 mL | INTRAMUSCULAR | Status: DC | PRN

## 2015-01-20 MED ORDER — GLYCOPYRROLATE 0.2 MG/ML IJ SOLN
0.2000 mg | Freq: Once | INTRAMUSCULAR | Status: AC | PRN
  Administered 2015-01-20: 0.4 mg via INTRAVENOUS

## 2015-01-20 MED ORDER — CIPROFLOXACIN IN D5W 400 MG/200ML IV SOLN
400.0000 mg | INTRAVENOUS | Status: AC
  Administered 2015-01-20: 400 mg via INTRAVENOUS

## 2015-01-20 MED ORDER — BUPIVACAINE-EPINEPHRINE 0.5% -1:200000 IJ SOLN
INTRAMUSCULAR | Status: DC | PRN
  Administered 2015-01-20: 6.5 mL

## 2015-01-20 MED ORDER — ACETAMINOPHEN 325 MG PO TABS: 650.0000 mg | ORAL_TABLET | ORAL | Status: DC | PRN

## 2015-01-20 MED ORDER — OXYCODONE HCL 5 MG PO TABS: 5.0000 mg | ORAL_TABLET | ORAL | Status: DC | PRN

## 2015-01-20 MED ORDER — FENTANYL CITRATE (PF) 100 MCG/2ML IJ SOLN
25.0000 ug | INTRAMUSCULAR | Status: DC | PRN
  Administered 2015-01-20: 50 ug via INTRAVENOUS

## 2015-01-20 MED ORDER — NEOSTIGMINE METHYLSULFATE 10 MG/10ML IV SOLN
INTRAVENOUS | Status: DC | PRN
  Administered 2015-01-20: 3 mg via INTRAVENOUS

## 2015-01-20 MED ORDER — SODIUM CHLORIDE 0.9 % IV SOLN: 250.0000 mL | INTRAVENOUS | Status: DC | PRN

## 2015-01-20 MED ORDER — MIDAZOLAM HCL 2 MG/2ML IJ SOLN
INTRAMUSCULAR | Status: AC
  Filled 2015-01-20: qty 4

## 2015-01-20 SURGICAL SUPPLY — 52 items
ADH SKN CLS APL DERMABOND .7 (GAUZE/BANDAGES/DRESSINGS) ×2
APPLIER CLIP ROT 10 11.4 M/L (STAPLE) ×4
APR CLP MED LRG 11.4X10 (STAPLE) ×2
BAG SPEC RTRVL LRG 6X4 10 (ENDOMECHANICALS) ×2
BLADE CLIPPER SURG (BLADE) IMPLANT
CANISTER SUCT 1200ML W/VALVE (MISCELLANEOUS) ×4 IMPLANT
CHLORAPREP W/TINT 26ML (MISCELLANEOUS) ×4 IMPLANT
CLIP APPLIE ROT 10 11.4 M/L (STAPLE) ×2 IMPLANT
CLOSURE WOUND 1/2 X4 (GAUZE/BANDAGES/DRESSINGS)
COVER MAYO STAND STRL (DRAPES) IMPLANT
DECANTER SPIKE VIAL GLASS SM (MISCELLANEOUS) ×1 IMPLANT
DERMABOND ADVANCED (GAUZE/BANDAGES/DRESSINGS) ×2
DERMABOND ADVANCED .7 DNX12 (GAUZE/BANDAGES/DRESSINGS) ×2 IMPLANT
DRAPE C-ARM 42X72 X-RAY (DRAPES) IMPLANT
DRAPE LAPAROSCOPIC ABDOMINAL (DRAPES) ×4 IMPLANT
ELECT REM PT RETURN 9FT ADLT (ELECTROSURGICAL) ×4
ELECTRODE REM PT RTRN 9FT ADLT (ELECTROSURGICAL) ×2 IMPLANT
FILTER SMOKE EVAC LAPAROSHD (FILTER) IMPLANT
GLOVE BIO SURGEON STRL SZ 6.5 (GLOVE) ×2 IMPLANT
GLOVE BIO SURGEON STRL SZ7 (GLOVE) ×3 IMPLANT
GLOVE BIO SURGEON STRL SZ7.5 (GLOVE) ×3 IMPLANT
GLOVE BIO SURGEONS STRL SZ 6.5 (GLOVE) ×1
GLOVE BIOGEL PI IND STRL 7.0 (GLOVE) ×1 IMPLANT
GLOVE BIOGEL PI IND STRL 7.5 (GLOVE) ×1 IMPLANT
GLOVE BIOGEL PI IND STRL 8 (GLOVE) ×1 IMPLANT
GLOVE BIOGEL PI INDICATOR 7.0 (GLOVE) ×2
GLOVE BIOGEL PI INDICATOR 7.5 (GLOVE) ×2
GLOVE BIOGEL PI INDICATOR 8 (GLOVE) ×2
GLOVE EUDERMIC 7 POWDERFREE (GLOVE) ×4 IMPLANT
GLOVE EXAM NITRILE LRG STRL (GLOVE) ×3 IMPLANT
GOWN STRL REUS W/ TWL LRG LVL3 (GOWN DISPOSABLE) ×5 IMPLANT
GOWN STRL REUS W/ TWL XL LVL3 (GOWN DISPOSABLE) ×2 IMPLANT
GOWN STRL REUS W/TWL LRG LVL3 (GOWN DISPOSABLE) ×12
GOWN STRL REUS W/TWL XL LVL3 (GOWN DISPOSABLE) ×4
HEMOSTAT SNOW SURGICEL 2X4 (HEMOSTASIS) IMPLANT
NS IRRIG 1000ML POUR BTL (IV SOLUTION) IMPLANT
PACK BASIN DAY SURGERY FS (CUSTOM PROCEDURE TRAY) ×4 IMPLANT
POUCH SPECIMEN RETRIEVAL 10MM (ENDOMECHANICALS) ×4 IMPLANT
SCISSORS LAP 5X35 DISP (ENDOMECHANICALS) ×4 IMPLANT
SET CHOLANGIOGRAPH 5 50 .035 (SET/KITS/TRAYS/PACK) IMPLANT
SET IRRIG TUBING LAPAROSCOPIC (IRRIGATION / IRRIGATOR) ×4 IMPLANT
SLEEVE ENDOPATH XCEL 5M (ENDOMECHANICALS) ×7 IMPLANT
SLEEVE SCD COMPRESS KNEE MED (MISCELLANEOUS) ×4 IMPLANT
STRIP CLOSURE SKIN 1/2X4 (GAUZE/BANDAGES/DRESSINGS) IMPLANT
SUT MNCRL AB 4-0 PS2 18 (SUTURE) ×4 IMPLANT
SUT VICRYL 0 UR6 27IN ABS (SUTURE) IMPLANT
TOWEL OR 17X24 6PK STRL BLUE (TOWEL DISPOSABLE) ×4 IMPLANT
TRAY LAPAROSCOPIC (CUSTOM PROCEDURE TRAY) ×4 IMPLANT
TROCAR XCEL BLUNT TIP 100MML (ENDOMECHANICALS) ×4 IMPLANT
TROCAR XCEL NON-BLD 11X100MML (ENDOMECHANICALS) ×1 IMPLANT
TROCAR XCEL NON-BLD 5MMX100MML (ENDOMECHANICALS) ×4 IMPLANT
TUBING INSUFFLATION (TUBING) ×4 IMPLANT

## 2015-01-20 NOTE — Addendum Note (Signed)
Addendum  created 01/20/15 1016 by Jillyn Hidden, MD   Modules edited: Orders, PRL Based Order Sets

## 2015-01-20 NOTE — Op Note (Signed)
Patient Name:           Tara Brooks   Date of Surgery:        01/20/2015  Pre op Diagnosis:      Chronic cholecystitis with cholelithiasis  Post op Diagnosis:    Chronic cholecystitis with cholelithiasis  Procedure:                 Laparoscopic cholecystectomy  Surgeon:                     Edsel Petrin. Dalbert Batman, M.D., FACS  Assistant:                      Donnie Mesa, M.D., [facility]  Operative Indications:   This is a very pleasant, very healthy 54 year old Caucasian female, referred by Dr. Delfin Edis evaluation and management of symptomatic gallstones. Dr. Dagmar Hait is her PCP. Dr. Kendall Flack is her gynecologist. The patient gives an 42 month history of intermittent episodes of epigastric and back pain. She says that the pattern is that if she overeats or eats the wrong foods she will wake up in the middle of the night with epigastric pain and back pain. She has had some episodes of nausea and vomiting associated with this. No diarrhea. These attacks generally occur at least once a month and will sometimes occur as a cluster. She's been taking Protonix and Zantac for a long time because of presumed GERD. Her diet is fairly low low-fat, mostly vegetables chicken and fish .    Comorbidities are minimal. Upper endoscopy in June 2015 showing a small 2 cm hiatal hernia. Colonoscopy March 2013 normal.  Ultrasound shows gallstones.  Hepatobiliary scan shows nonvisualization of the gallbladder. She will be scheduled for laparoscopic cholecystectomy with cholangiogram, possible open.  She is brought to the operating room electively  Operative Findings:       The gallbladder was chronically inflamed.  Discolored, somewhat thick walls.  But visualization of the anatomy was good.  The anatomy of the cystic duct cystic artery and common bile duct were conventional.  We were able to create a good critical view.  The liver appeared healthy.  The stomach, duodenum, small intestine, large  intestine were grossly normal to inspection.  Procedure in Detail:          Following the induction of general endotracheal anesthesia the patient's abdomen was prepped and draped in a sterile fashion.  Surgical timeout was performed.  Intravenous antibiotics were given.  0.5% Marcaine with epinephrine was used as a local infiltration and aesthetic.     A short vertical incision was made in the lower rim of the umbilicus.  The fascia was incised in the midline.  The abdominal cavity was entered under direct vision.  An 11 mm Hassan trocar was inserted and secured with the Purstring suture of 0 Vicryl.  Pneumoperitoneum was created.  Video camera was inserted.  A 5 mm trocar was placed in the subxiphoid region and two 5 mm trochars placed in the right abdomen.  The patient's body habitus is quite small and so the trochars had to be placed fairly low.  The gallbladder fundus was elevated.  The infundibulum was identified and retracted.  I incised the peritoneum overlying the neck of the gallbladder.  I carefully dissected out the cystic artery and cystic duct until I had a very large window behind both structures.  Her liver function tests were normal.  I chose not to  do a cholangiogram.  The cystic duct and the cystic artery were secured with multiple metal clips and divided.  The gallbladder was dissected from its bed with electrocautery, placed in a specimen bag and removed.  The operative field was inspected and irrigated.  There was no bleeding.  There was no bile leak.  The trochars were removed and there was no bleeding from the trocar sites.  The pneumoperitoneum was released.  The fascia at the umbilicus was closed with 0 Vicryl sutures.  The skin incisions were closed with subcuticular sutures of 4-0 Monocryl and Dermabond.  The patient tolerated the procedure well was taken to PACU in stable condition.  EBL 10 mL.  Counts correct.  Complications none.     Edsel Petrin. Dalbert Batman, M.D., FACS General and  Minimally Invasive Surgery Breast and Colorectal Surgery  01/20/2015 8:33 AM

## 2015-01-20 NOTE — Anesthesia Procedure Notes (Signed)
Procedure Name: Intubation Date/Time: 01/20/2015 7:36 AM Performed by: Lyndee Leo Pre-anesthesia Checklist: Patient identified, Emergency Drugs available, Suction available and Patient being monitored Patient Re-evaluated:Patient Re-evaluated prior to inductionOxygen Delivery Method: Circle System Utilized Preoxygenation: Pre-oxygenation with 100% oxygen Intubation Type: IV induction Ventilation: Mask ventilation without difficulty Laryngoscope Size: Mac and 3 Grade View: Grade II Tube type: Oral Tube size: 7.0 mm Number of attempts: 1 Airway Equipment and Method: Stylet,  Oral airway and Bite block Placement Confirmation: ETT inserted through vocal cords under direct vision,  positive ETCO2 and breath sounds checked- equal and bilateral Secured at: 22 cm Tube secured with: Tape Dental Injury: Teeth and Oropharynx as per pre-operative assessment

## 2015-01-20 NOTE — Discharge Instructions (Signed)
[facility] ______[facility] °LAPAROSCOPIC SURGERY: POST OP INSTRUCTIONS °Always review your discharge instruction sheet given to you by the facility where your surgery was performed. °IF YOU HAVE DISABILITY OR FAMILY LEAVE FORMS, YOU MUST BRING THEM TO THE OFFICE FOR PROCESSING.   °DO NOT GIVE THEM TO YOUR DOCTOR. ° °1. A prescription for pain medication may be given to you upon discharge.  Take your pain medication as prescribed, if needed.  If narcotic pain medicine is not needed, then you may take acetaminophen (Tylenol) or ibuprofen (Advil) as needed. °2. Take your usually prescribed medications unless otherwise directed. °3. If you need a refill on your pain medication, please contact your pharmacy.  They will contact our office to request authorization. Prescriptions will not be filled after 5pm or on week-ends. °4. You should follow a light diet the first few days after arrival home, such as soup and crackers, etc.  Be sure to include lots of fluids daily. °5. Most patients will experience some swelling and bruising in the area of the incisions.  Ice packs will help.  Swelling and bruising can take several days to resolve.  °6. It is common to experience some constipation if taking pain medication after surgery.  Increasing fluid intake and taking a stool softener (such as Colace) will usually help or prevent this problem from occurring.  A mild laxative (Milk of Magnesia or Miralax) should be taken according to package instructions if there are no bowel movements after 48 hours. °7. Unless discharge instructions indicate otherwise, you may remove your bandages 24-48 hours after surgery, and you may shower at that time.  You may have steri-strips (small skin tapes) in place directly over the incision.  These strips should be left on the skin for 7-10 days.  If your surgeon used skin glue on the incision, you may shower in 24 hours.  The glue will flake off over the next 2-3 weeks.  Any sutures or  staples will be removed at the office during your follow-up visit. °8. ACTIVITIES:  You may resume regular (light) daily activities beginning the next day--such as daily self-care, walking, climbing stairs--gradually increasing activities as tolerated.  You may have sexual intercourse when it is comfortable.  Refrain from any heavy lifting or straining until approved by your doctor. °a. You may drive when you are no longer taking prescription pain medication, you can comfortably wear a seatbelt, and you can safely maneuver your car and apply brakes. °b. RETURN TO WORK:  __________________________________________________________ °9. You should see your doctor in the office for a follow-up appointment approximately 2-3 weeks after your surgery.  Make sure that you call for this appointment within a day or two after you arrive home to insure a convenient appointment time. °10. OTHER INSTRUCTIONS: __________________________________________________________________________________________________________________________ __________________________________________________________________________________________________________________________ °WHEN TO CALL YOUR DOCTOR: °1. Fever over 101.0 °2. Inability to urinate °3. Continued bleeding from incision. °4. Increased pain, redness, or drainage from the incision. °5. Increasing abdominal pain ° °The clinic staff is available to answer your questions during regular business hours.  Please don’t hesitate to call and ask to speak to one of the nurses for clinical concerns.  If you have a medical emergency, go to the nearest emergency room or call 911.  A surgeon from [facility] is always on call at the hospital. °[address], [address], [place], [place]  [address] ? [address] [address], [place], [place]   [address] °[phone] ? [phone] ? FAX [phone] °Web site:   [email or web address] ° °Post Anesthesia Home Care Instructions ° °Activity: °Get  plenty of rest for the remainder of the day. A responsible adult should stay with you for 24 hours following the procedure.  °For the next 24 hours, DO NOT: °-Drive a car °-Operate machinery °-Drink alcoholic beverages °-Take any medication unless instructed by your physician °-Make any legal decisions or sign important papers. ° °Meals: °Start with liquid foods such as gelatin or soup. Progress to regular foods as tolerated. Avoid greasy, spicy, heavy foods. If nausea and/or vomiting occur, drink only clear liquids until the nausea and/or vomiting subsides. Call your physician if vomiting continues. ° °Special Instructions/Symptoms: °Your throat may feel dry or sore from the anesthesia or the breathing tube placed in your throat during surgery. If this causes discomfort, gargle with warm salt water. The discomfort should disappear within 24 hours. ° °If you had a scopolamine patch placed behind your ear for the management of post- operative nausea and/or vomiting: ° °1. The medication in the patch is effective for 72 hours, after which it should be removed.  Wrap patch in a tissue and discard in the trash. Wash hands thoroughly with soap and water. °2. You may remove the patch earlier than 72 hours if you experience unpleasant side effects which may include dry mouth, dizziness or visual disturbances. °3. Avoid touching the patch. Wash your hands with soap and water after contact with the patch. °  ° °

## 2015-01-20 NOTE — Anesthesia Preprocedure Evaluation (Addendum)
Anesthesia Evaluation  Patient identified by MRN, date of birth, ID band Patient awake    Reviewed: Allergy & Precautions, H&P , NPO status , Patient's Chart, lab work & pertinent test results  History of Anesthesia Complications Negative for: history of anesthetic complications  Airway Mallampati: I  TM Distance: >3 FB Neck ROM: full    Dental no notable dental hx. (+) Dental Advisory Given   Pulmonary neg pulmonary ROS,    Pulmonary exam normal breath sounds clear to auscultation       Cardiovascular negative cardio ROS Normal cardiovascular exam Rhythm:regular Rate:Normal     Neuro/Psych negative neurological ROS     GI/Hepatic Neg liver ROS, GERD  ,  Endo/Other  negative endocrine ROS  Renal/GU negative Renal ROS     Musculoskeletal   Abdominal   Peds  Hematology negative hematology ROS (+)   Anesthesia Other Findings   Reproductive/Obstetrics negative OB ROS                            Anesthesia Physical Anesthesia Plan  ASA: II  Anesthesia Plan: General   Post-op Pain Management:    Induction: Intravenous  Airway Management Planned: Oral ETT  Additional Equipment:   Intra-op Plan:   Post-operative Plan: Extubation in OR  Informed Consent: I have reviewed the patients History and Physical, chart, labs and discussed the procedure including the risks, benefits and alternatives for the proposed anesthesia with the patient or authorized representative who has indicated his/her understanding and acceptance.   Dental Advisory Given  Plan Discussed with: Anesthesiologist, CRNA and Surgeon  Anesthesia Plan Comments:         Anesthesia Quick Evaluation

## 2015-01-20 NOTE — Anesthesia Postprocedure Evaluation (Signed)
  Anesthesia Post-op Note  Patient: Tara Brooks  Procedure(s) Performed: Procedure(s) (LRB): LAPAROSCOPIC CHOLECYSTECTOMY (N/A)  Patient Location: PACU  Anesthesia Type: General  Level of Consciousness: awake and alert   Airway and Oxygen Therapy: Patient Spontanous Breathing  Post-op Pain: mild  Post-op Assessment: Post-op Vital signs reviewed, Patient's Cardiovascular Status Stable, Respiratory Function Stable, Patent Airway and No signs of Nausea or vomiting  Last Vitals:  Filed Vitals:   01/20/15 0900  BP: 158/90  Pulse: 63  Temp:   Resp: 19    Post-op Vital Signs: stable   Complications: No apparent anesthesia complications

## 2015-01-20 NOTE — Interval H&P Note (Signed)
History and Physical Interval Note:  01/20/2015 6:57 AM  Tara Brooks  has presented today for surgery, with the diagnosis of Gallstones  The various methods of treatment have been discussed with the patient and family. After consideration of risks, benefits and other options for treatment, the patient has consented to  Procedure(s): LAPAROSCOPIC CHOLECYSTECTOMY WITH INTRAOPERATIVE CHOLANGIOGRAM POSSBILE OPEN (N/A) as a surgical intervention .  The patient's history has been reviewed, patient examined, no change in status, stable for surgery.  I have reviewed the patient's chart and labs.  Questions were answered to the patient's satisfaction.     Adin Hector

## 2015-01-20 NOTE — Transfer of Care (Signed)
Immediate Anesthesia Transfer of Care Note  Patient: Tara Brooks  Procedure(s) Performed: Procedure(s): LAPAROSCOPIC CHOLECYSTECTOMY (N/A)  Patient Location: PACU  Anesthesia Type:General  Level of Consciousness: alert , sedated and patient cooperative  Airway & Oxygen Therapy: Patient Spontanous Breathing and Patient connected to face mask oxygen  Post-op Assessment: Report given to RN and Post -op Vital signs reviewed and stable  Post vital signs: Reviewed and stable  Last Vitals:  Filed Vitals:   01/20/15 0633  BP: 123/71  Pulse: 65  Temp: 36.7 C  Resp: 16    Complications: No apparent anesthesia complications

## 2015-01-21 ENCOUNTER — Encounter (HOSPITAL_BASED_OUTPATIENT_CLINIC_OR_DEPARTMENT_OTHER): Payer: Self-pay | Admitting: General Surgery

## 2015-02-19 ENCOUNTER — Other Ambulatory Visit: Payer: Self-pay | Admitting: Obstetrics and Gynecology

## 2015-02-19 DIAGNOSIS — N6310 Unspecified lump in the right breast, unspecified quadrant: Secondary | ICD-10-CM

## 2015-02-25 ENCOUNTER — Other Ambulatory Visit: Payer: BLUE CROSS/BLUE SHIELD

## 2015-02-26 ENCOUNTER — Ambulatory Visit
Admission: RE | Admit: 2015-02-26 | Discharge: 2015-02-26 | Disposition: A | Discharge status: Home / Self Care | Payer: BLUE CROSS/BLUE SHIELD | Source: Ambulatory Visit | Attending: Obstetrics and Gynecology | Admitting: Obstetrics and Gynecology

## 2015-02-26 ENCOUNTER — Other Ambulatory Visit: Payer: Self-pay | Admitting: Obstetrics and Gynecology

## 2015-02-26 DIAGNOSIS — N6310 Unspecified lump in the right breast, unspecified quadrant: Secondary | ICD-10-CM

## 2015-07-17 ENCOUNTER — Other Ambulatory Visit: Payer: Self-pay | Admitting: Obstetrics and Gynecology

## 2015-07-17 DIAGNOSIS — N6310 Unspecified lump in the right breast, unspecified quadrant: Secondary | ICD-10-CM

## 2015-07-21 ENCOUNTER — Other Ambulatory Visit: Payer: BLUE CROSS/BLUE SHIELD

## 2015-07-22 ENCOUNTER — Ambulatory Visit
Admission: RE | Admit: 2015-07-22 | Discharge: 2015-07-22 | Disposition: A | Discharge status: Home / Self Care | Payer: 59 | Source: Ambulatory Visit | Attending: Obstetrics and Gynecology | Admitting: Obstetrics and Gynecology

## 2015-07-22 DIAGNOSIS — N6310 Unspecified lump in the right breast, unspecified quadrant: Secondary | ICD-10-CM

## 2015-07-23 ENCOUNTER — Other Ambulatory Visit: Payer: Self-pay | Admitting: Obstetrics and Gynecology

## 2015-07-23 DIAGNOSIS — N63 Unspecified lump in unspecified breast: Secondary | ICD-10-CM

## 2015-08-04 ENCOUNTER — Ambulatory Visit
Admission: RE | Admit: 2015-08-04 | Discharge: 2015-08-04 | Disposition: A | Discharge status: Home / Self Care | Payer: 59 | Source: Ambulatory Visit | Attending: Obstetrics and Gynecology | Admitting: Obstetrics and Gynecology

## 2015-08-04 DIAGNOSIS — N63 Unspecified lump in unspecified breast: Secondary | ICD-10-CM

## 2015-12-13 IMAGING — US US ABDOMEN COMPLETE
1 series · 14 of 25 positions shown · non-contrast
Comparison: None.

CLINICAL DATA: Generalized abdominal pain for approximately 1 year.

EXAM:
ULTRASOUND ABDOMEN COMPLETE

[Series 1: us abdomen complete · 0.15mm/px · 14 of 83 slices shown]
[im 1/83]
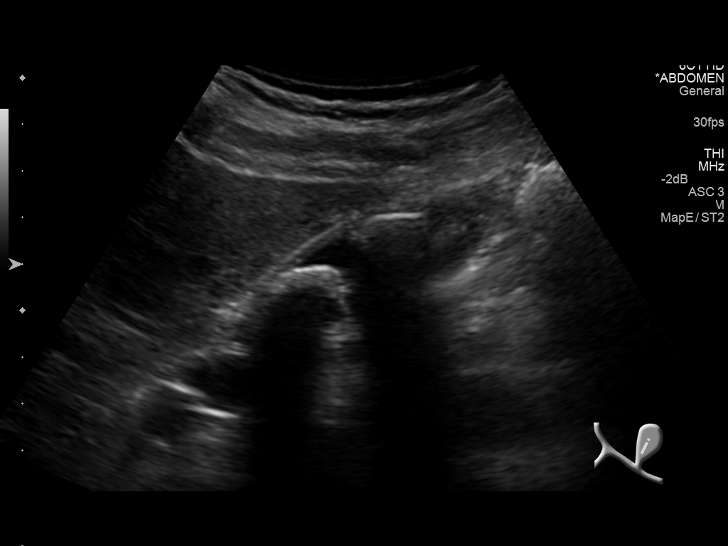
[im 7/83]
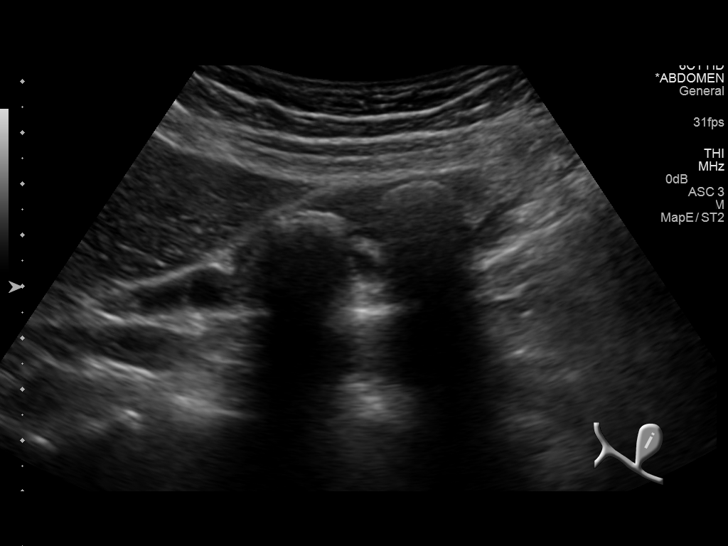
[im 14/83]
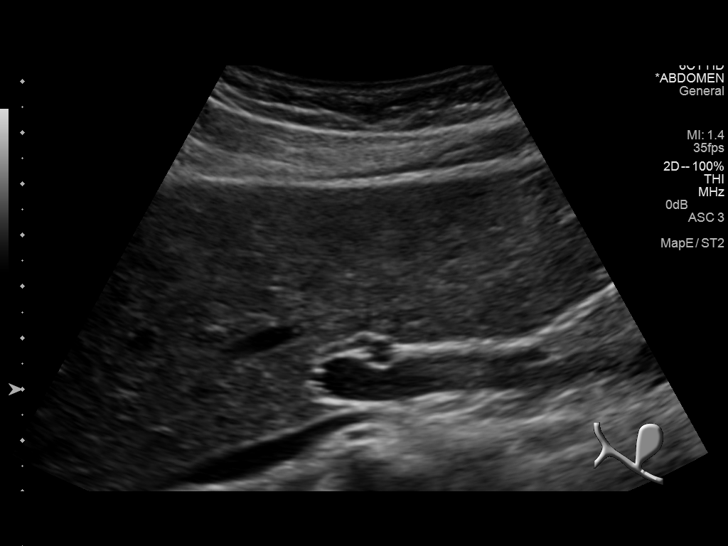
[im 21/83]
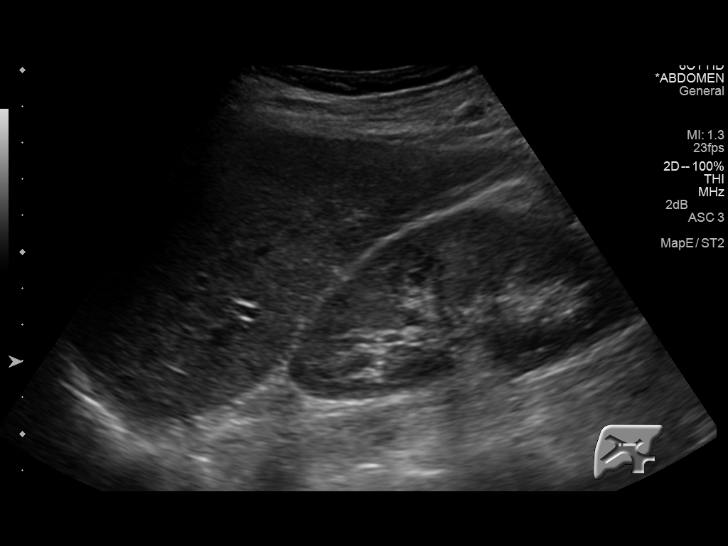
[im 28/83]
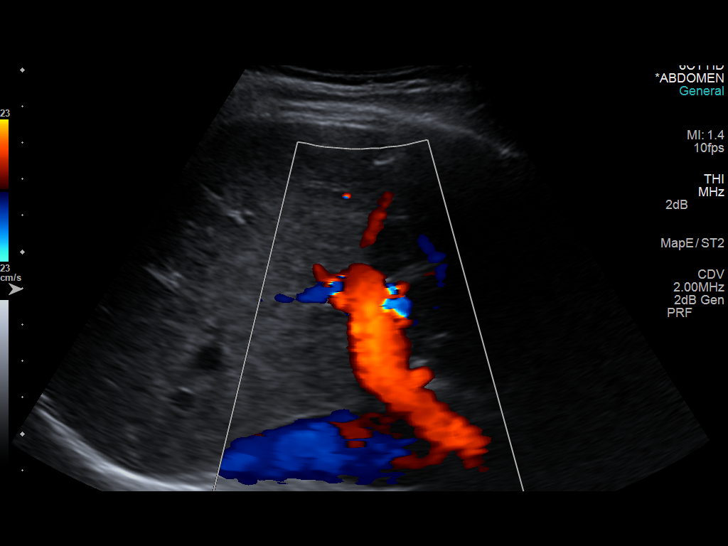
[im 31/83]
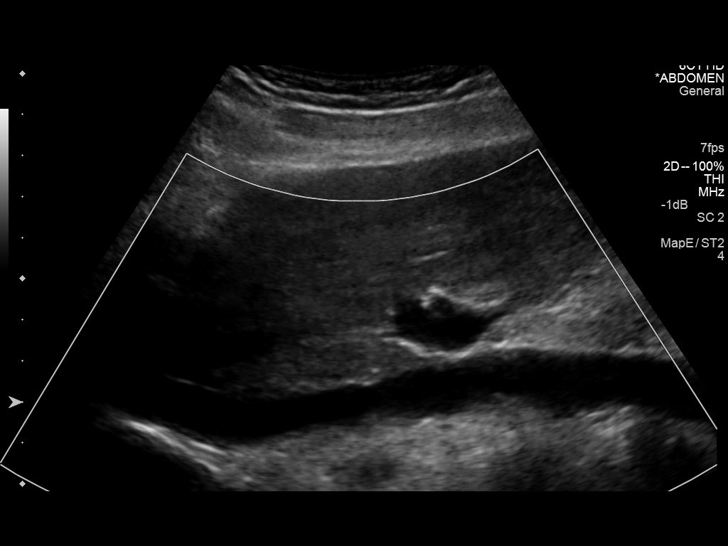
[im 38/83]
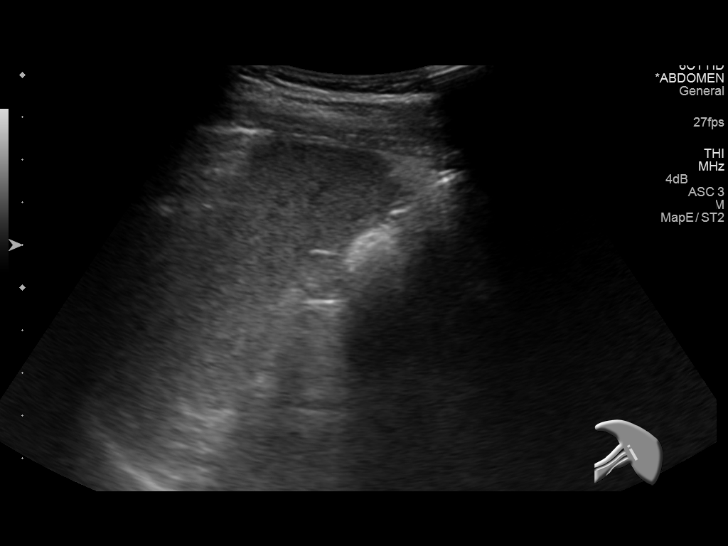
[im 45/83]
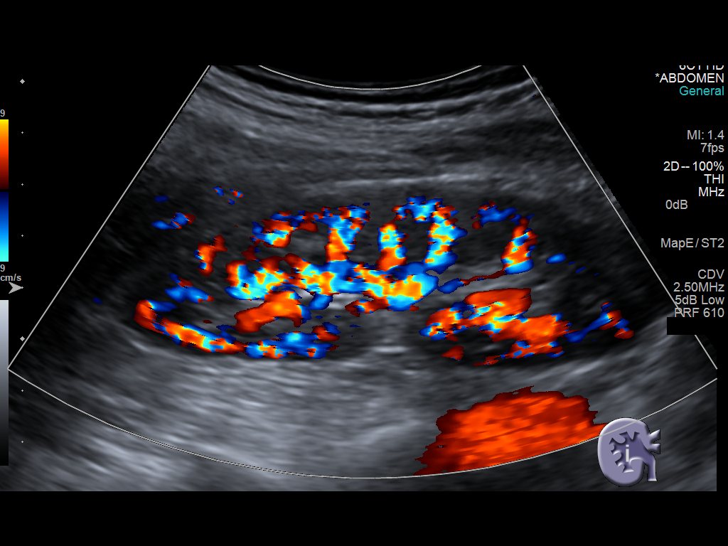
[im 52/83]
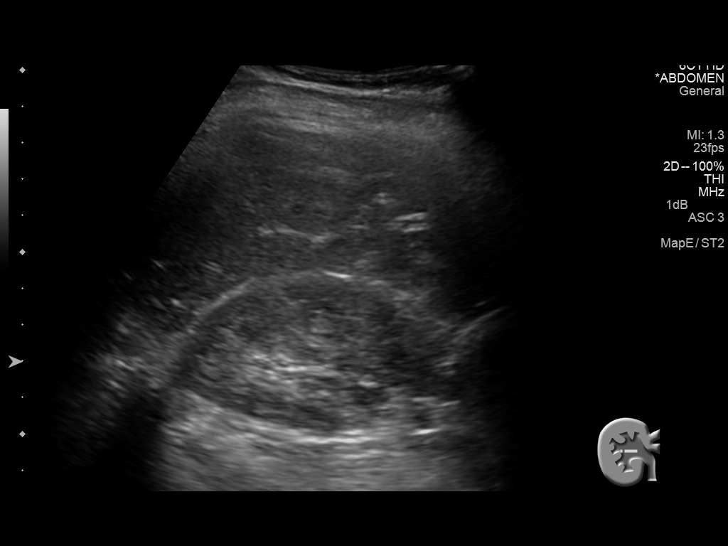
[im 55/83]
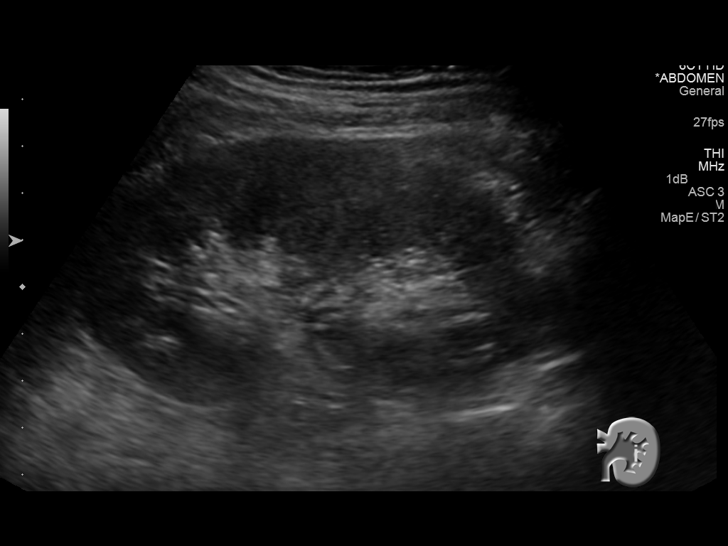
[im 62/83]
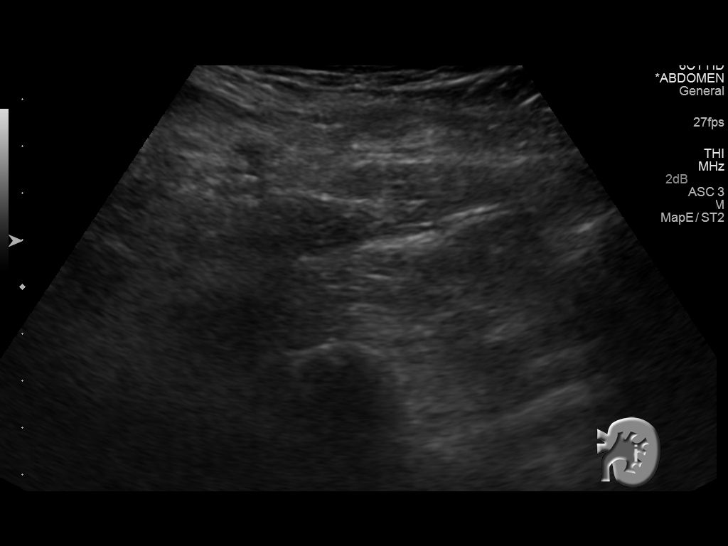
[im 69/83]
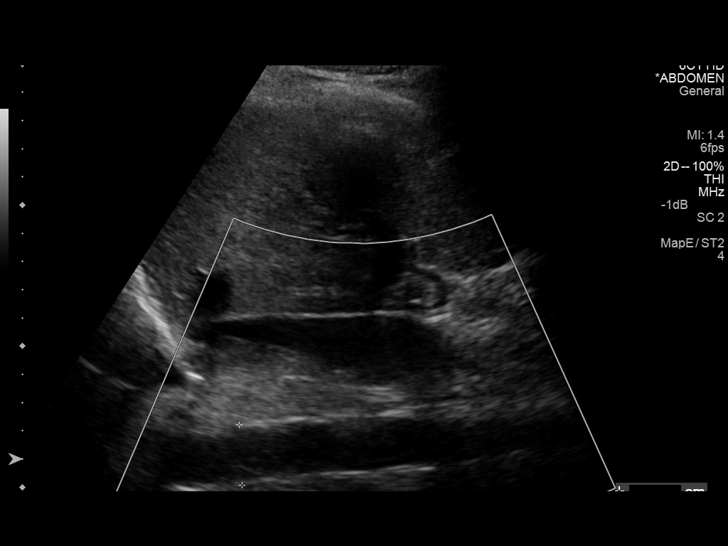
[im 76/83]
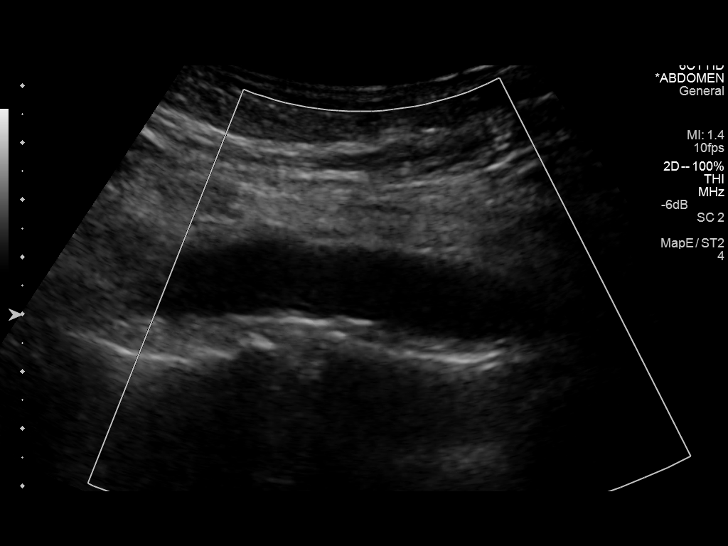
[im 83/83]
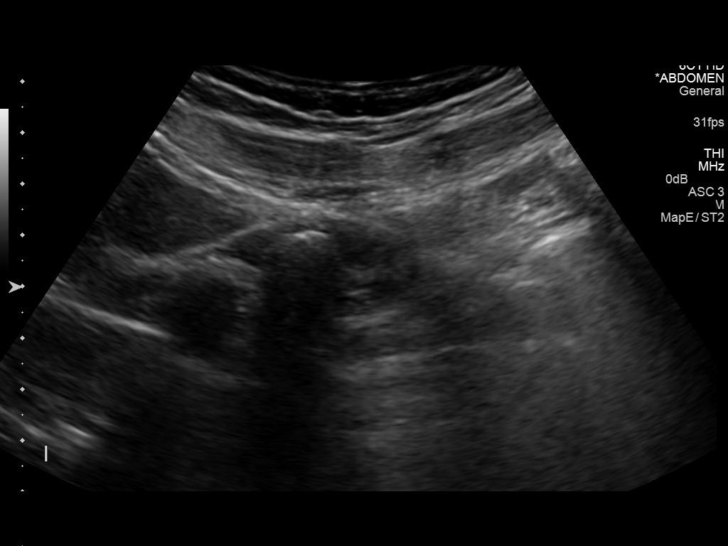

[14 of 25 positions shown; findings below may reference images not displayed]

FINDINGS: Gallbladder: Multiple calcified gallstones are seen, largest
measuring 1.5 cm. No evidence of gallbladder dilatation or wall
thickening. No sonographic Murphy sign noted by sonographer.

Common bile duct: Diameter: 2 mm, within normal limits.

Liver: No focal lesion identified. Within normal limits in
parenchymal echogenicity.

IVC: No abnormality visualized.

Pancreas: Visualized portion unremarkable.

Spleen: Size and appearance within normal limits.

Right Kidney: Length: 10.8 cm. Echogenicity within normal limits. No
mass or hydronephrosis visualized.

Left Kidney: Length: 10.3 cm. Echogenicity within normal limits. No
mass or hydronephrosis visualized.

Abdominal aorta: No aneurysm visualized.

Other findings: None.
IMPRESSION: Cholelithiasis. No sonographic signs of acute cholecystitis, biliary
dilatation, or other significant abnormality.

## 2016-01-08 ENCOUNTER — Other Ambulatory Visit: Payer: Self-pay | Admitting: Obstetrics and Gynecology

## 2016-01-08 DIAGNOSIS — N63 Unspecified lump in unspecified breast: Secondary | ICD-10-CM

## 2016-02-01 ENCOUNTER — Other Ambulatory Visit: Payer: Self-pay | Admitting: Obstetrics and Gynecology

## 2016-02-01 ENCOUNTER — Ambulatory Visit
Admission: RE | Admit: 2016-02-01 | Discharge: 2016-02-01 | Disposition: A | Discharge status: Home / Self Care | Payer: 59 | Source: Ambulatory Visit | Attending: Obstetrics and Gynecology | Admitting: Obstetrics and Gynecology

## 2016-02-01 DIAGNOSIS — N63 Unspecified lump in unspecified breast: Secondary | ICD-10-CM

## 2016-02-18 ENCOUNTER — Other Ambulatory Visit: Payer: Self-pay | Admitting: Obstetrics and Gynecology

## 2016-02-18 DIAGNOSIS — N63 Unspecified lump in unspecified breast: Secondary | ICD-10-CM

## 2016-02-22 ENCOUNTER — Ambulatory Visit
Admission: RE | Admit: 2016-02-22 | Discharge: 2016-02-22 | Disposition: A | Discharge status: Home / Self Care | Payer: 59 | Source: Ambulatory Visit | Attending: Obstetrics and Gynecology | Admitting: Obstetrics and Gynecology

## 2016-02-22 DIAGNOSIS — N63 Unspecified lump in unspecified breast: Secondary | ICD-10-CM

## 2016-05-12 DIAGNOSIS — D2239 Melanocytic nevi of other parts of face: Secondary | ICD-10-CM | POA: Diagnosis not present

## 2016-05-12 DIAGNOSIS — L708 Other acne: Secondary | ICD-10-CM | POA: Diagnosis not present

## 2016-05-12 DIAGNOSIS — L821 Other seborrheic keratosis: Secondary | ICD-10-CM | POA: Diagnosis not present

## 2016-05-12 DIAGNOSIS — B078 Other viral warts: Secondary | ICD-10-CM | POA: Diagnosis not present

## 2016-06-03 DIAGNOSIS — L500 Allergic urticaria: Secondary | ICD-10-CM | POA: Diagnosis not present

## 2016-07-26 DIAGNOSIS — Z0000 Encounter for general adult medical examination without abnormal findings: Secondary | ICD-10-CM | POA: Diagnosis not present

## 2016-07-29 ENCOUNTER — Other Ambulatory Visit: Payer: Self-pay | Admitting: Obstetrics and Gynecology

## 2016-07-29 DIAGNOSIS — Z1231 Encounter for screening mammogram for malignant neoplasm of breast: Secondary | ICD-10-CM

## 2016-07-29 DIAGNOSIS — Z0000 Encounter for general adult medical examination without abnormal findings: Secondary | ICD-10-CM | POA: Diagnosis not present

## 2016-07-29 DIAGNOSIS — N63 Unspecified lump in unspecified breast: Secondary | ICD-10-CM

## 2016-08-02 ENCOUNTER — Other Ambulatory Visit: Payer: 59

## 2016-08-02 ENCOUNTER — Ambulatory Visit
Admission: RE | Admit: 2016-08-02 | Discharge: 2016-08-02 | Disposition: A | Discharge status: Home / Self Care | Payer: 59 | Source: Ambulatory Visit | Attending: Obstetrics and Gynecology | Admitting: Obstetrics and Gynecology

## 2016-08-02 DIAGNOSIS — N63 Unspecified lump in unspecified breast: Secondary | ICD-10-CM

## 2016-08-04 DIAGNOSIS — Z1212 Encounter for screening for malignant neoplasm of rectum: Secondary | ICD-10-CM | POA: Diagnosis not present

## 2016-08-04 DIAGNOSIS — N943 Premenstrual tension syndrome: Secondary | ICD-10-CM | POA: Diagnosis not present

## 2016-08-04 DIAGNOSIS — Z780 Asymptomatic menopausal state: Secondary | ICD-10-CM | POA: Diagnosis not present

## 2016-08-04 DIAGNOSIS — Z01419 Encounter for gynecological examination (general) (routine) without abnormal findings: Secondary | ICD-10-CM | POA: Diagnosis not present

## 2016-08-05 ENCOUNTER — Ambulatory Visit
Admission: RE | Admit: 2016-08-05 | Discharge: 2016-08-05 | Disposition: A | Discharge status: Home / Self Care | Payer: 59 | Source: Ambulatory Visit | Attending: Obstetrics and Gynecology | Admitting: Obstetrics and Gynecology

## 2016-08-05 DIAGNOSIS — N6489 Other specified disorders of breast: Secondary | ICD-10-CM | POA: Diagnosis not present

## 2016-08-05 DIAGNOSIS — R928 Other abnormal and inconclusive findings on diagnostic imaging of breast: Secondary | ICD-10-CM | POA: Diagnosis not present

## 2016-11-07 DIAGNOSIS — J300 Vasomotor rhinitis: Secondary | ICD-10-CM | POA: Diagnosis not present

## 2017-01-30 DIAGNOSIS — Z23 Encounter for immunization: Secondary | ICD-10-CM | POA: Diagnosis not present

## 2017-04-14 DIAGNOSIS — R233 Spontaneous ecchymoses: Secondary | ICD-10-CM | POA: Diagnosis not present

## 2017-04-14 DIAGNOSIS — W57XXXA Bitten or stung by nonvenomous insect and other nonvenomous arthropods, initial encounter: Secondary | ICD-10-CM | POA: Diagnosis not present

## 2017-06-01 DIAGNOSIS — Z6822 Body mass index (BMI) 22.0-22.9, adult: Secondary | ICD-10-CM | POA: Diagnosis not present

## 2017-06-01 DIAGNOSIS — L84 Corns and callosities: Secondary | ICD-10-CM | POA: Diagnosis not present

## 2017-06-14 DIAGNOSIS — M67449 Ganglion, unspecified hand: Secondary | ICD-10-CM | POA: Diagnosis not present

## 2017-06-27 ENCOUNTER — Other Ambulatory Visit: Payer: Self-pay | Admitting: Obstetrics and Gynecology

## 2017-06-27 DIAGNOSIS — Z1231 Encounter for screening mammogram for malignant neoplasm of breast: Secondary | ICD-10-CM

## 2017-08-17 DIAGNOSIS — Z6822 Body mass index (BMI) 22.0-22.9, adult: Secondary | ICD-10-CM | POA: Diagnosis not present

## 2017-08-17 DIAGNOSIS — Z1231 Encounter for screening mammogram for malignant neoplasm of breast: Secondary | ICD-10-CM | POA: Diagnosis not present

## 2017-08-17 DIAGNOSIS — N943 Premenstrual tension syndrome: Secondary | ICD-10-CM | POA: Diagnosis not present

## 2017-08-17 DIAGNOSIS — Z01419 Encounter for gynecological examination (general) (routine) without abnormal findings: Secondary | ICD-10-CM | POA: Diagnosis not present

## 2017-08-24 ENCOUNTER — Ambulatory Visit: Payer: 59

## 2017-10-25 DIAGNOSIS — R82998 Other abnormal findings in urine: Secondary | ICD-10-CM | POA: Diagnosis not present

## 2017-10-25 DIAGNOSIS — Z0000 Encounter for general adult medical examination without abnormal findings: Secondary | ICD-10-CM | POA: Diagnosis not present

## 2017-11-02 DIAGNOSIS — Z8249 Family history of ischemic heart disease and other diseases of the circulatory system: Secondary | ICD-10-CM | POA: Diagnosis not present

## 2017-11-02 DIAGNOSIS — Z1389 Encounter for screening for other disorder: Secondary | ICD-10-CM | POA: Diagnosis not present

## 2017-11-02 DIAGNOSIS — Z6822 Body mass index (BMI) 22.0-22.9, adult: Secondary | ICD-10-CM | POA: Diagnosis not present

## 2017-11-02 DIAGNOSIS — Z0000 Encounter for general adult medical examination without abnormal findings: Secondary | ICD-10-CM | POA: Diagnosis not present

## 2017-11-03 DIAGNOSIS — Z1212 Encounter for screening for malignant neoplasm of rectum: Secondary | ICD-10-CM | POA: Diagnosis not present

## 2017-11-25 DIAGNOSIS — Z23 Encounter for immunization: Secondary | ICD-10-CM | POA: Diagnosis not present

## 2018-01-31 DIAGNOSIS — Z23 Encounter for immunization: Secondary | ICD-10-CM | POA: Diagnosis not present

## 2021-09-06 ENCOUNTER — Emergency Department (HOSPITAL_BASED_OUTPATIENT_CLINIC_OR_DEPARTMENT_OTHER): Payer: 59

## 2021-09-06 ENCOUNTER — Other Ambulatory Visit (HOSPITAL_BASED_OUTPATIENT_CLINIC_OR_DEPARTMENT_OTHER): Payer: Self-pay

## 2021-09-06 ENCOUNTER — Other Ambulatory Visit: Payer: Self-pay

## 2021-09-06 ENCOUNTER — Emergency Department (HOSPITAL_BASED_OUTPATIENT_CLINIC_OR_DEPARTMENT_OTHER)
Admission: EM | Admit: 2021-09-06 | Discharge: 2021-09-06 | Disposition: A | Discharge status: Home / Self Care | Payer: 59 | Attending: Emergency Medicine | Admitting: Emergency Medicine

## 2021-09-06 ENCOUNTER — Encounter (HOSPITAL_BASED_OUTPATIENT_CLINIC_OR_DEPARTMENT_OTHER): Payer: Self-pay

## 2021-09-06 DIAGNOSIS — R112 Nausea with vomiting, unspecified: Secondary | ICD-10-CM | POA: Diagnosis not present

## 2021-09-06 DIAGNOSIS — H9391 Unspecified disorder of right ear: Secondary | ICD-10-CM | POA: Insufficient documentation

## 2021-09-06 DIAGNOSIS — R42 Dizziness and giddiness: Secondary | ICD-10-CM | POA: Diagnosis present

## 2021-09-06 LAB — URINALYSIS, ROUTINE W REFLEX MICROSCOPIC
Bilirubin Urine: NEGATIVE
Glucose, UA: NEGATIVE mg/dL
Hgb urine dipstick: NEGATIVE
Ketones, ur: NEGATIVE mg/dL
Leukocytes,Ua: NEGATIVE
Nitrite: NEGATIVE
Protein, ur: NEGATIVE mg/dL
Specific Gravity, Urine: 1.018 (ref 1.005–1.030)
pH: 6.5 (ref 5.0–8.0)

## 2021-09-06 LAB — COMPREHENSIVE METABOLIC PANEL
ALT: 19 U/L (ref 0–44)
AST: 20 U/L (ref 15–41)
Albumin: 4.6 g/dL (ref 3.5–5.0)
Alkaline Phosphatase: 45 U/L (ref 38–126)
Anion gap: 12 (ref 5–15)
BUN: 18 mg/dL (ref 6–20)
CO2: 24 mmol/L (ref 22–32)
Calcium: 9.7 mg/dL (ref 8.9–10.3)
Chloride: 104 mmol/L (ref 98–111)
Creatinine, Ser: 0.81 mg/dL (ref 0.44–1.00)
GFR, Estimated: >60 mL/min (ref >60)
Glucose, Bld: 104 mg/dL — ABNORMAL HIGH (ref 70–99)
Potassium: 3.9 mmol/L (ref 3.5–5.1)
Sodium: 140 mmol/L (ref 135–145)
Total Bilirubin: 0.4 mg/dL (ref 0.3–1.2)
Total Protein: 7.7 g/dL (ref 6.5–8.1)

## 2021-09-06 LAB — CBC
HCT: 38.8 % (ref 36.0–46.0)
Hemoglobin: 12.6 g/dL (ref 12.0–15.0)
MCH: 28.5 pg (ref 26.0–34.0)
MCHC: 32.5 g/dL (ref 30.0–36.0)
MCV: 87.8 fL (ref 80.0–100.0)
Platelets: 228 10*3/uL (ref 150–400)
RBC: 4.42 MIL/uL (ref 3.87–5.11)
RDW: 12.2 % (ref 11.5–15.5)
WBC: 5.3 10*3/uL (ref 4.0–10.5)
nRBC: 0 % (ref 0.0–0.2)

## 2021-09-06 LAB — CBG MONITORING, ED: Glucose-Capillary: 104 mg/dL — ABNORMAL HIGH (ref 70–99)

## 2021-09-06 LAB — TROPONIN I (HIGH SENSITIVITY): Troponin I (High Sensitivity): <2 ng/L (ref <18)

## 2021-09-06 MED ORDER — ONDANSETRON HCL 4 MG/2ML IJ SOLN
4.0000 mg | Freq: Once | INTRAMUSCULAR | Status: AC
  Administered 2021-09-06: 4 mg via INTRAVENOUS
  Filled 2021-09-06: qty 2

## 2021-09-06 MED ORDER — MECLIZINE HCL 25 MG PO TABS
25.0000 mg | ORAL_TABLET | Freq: Three times a day (TID) | ORAL | 0 refills | Status: AC | PRN
  Filled 2021-09-06: qty 30, 10d supply, fill #0

## 2021-09-06 MED ORDER — MECLIZINE HCL 25 MG PO TABS
25.0000 mg | ORAL_TABLET | Freq: Once | ORAL | Status: AC
Start: 2021-09-06 — End: 2021-09-06
  Administered 2021-09-06: 25 mg via ORAL
  Filled 2021-09-06: qty 1

## 2021-09-06 NOTE — Discharge Instructions (Signed)
Call the ENT physician to schedule an appointment today.  Return back to the ER if your symptoms worsen or if you have fevers or pain.  Take the medicine as prescribed for additional episodes of dizziness.

## 2021-09-06 NOTE — ED Provider Notes (Signed)
[facility] Provider Note   CSN: [id number] Arrival date & time: 09/06/21  1144     History  Chief Complaint  Patient presents with   Dizziness    Tara Brooks is a 61 y.o. female.  Presents chief complaint of room spinning dizziness nausea vomiting.  Symptoms ongoing since this morning.  She states getting up and moving her body makes symptoms worse.  Lying still makes it better.  Denies any headache or chest pain or abdominal pain no new numbness or weakness.  No fever no cough no vomiting or diarrhea.  She has been having a buzzing sensation in the right ear for about 10 days now.  She seen her primary care doctor for this and has been trying some loratadine and nasal spray without improvement.      Home Medications Prior to Admission medications   Medication Sig Start Date End Date Taking? Authorizing Provider  meclizine (ANTIVERT) 25 MG tablet Take 1 tablet (25 mg total) by mouth 3 (three) times daily as needed for dizziness. 09/06/21  Yes Marlowe Cinquemani, Greggory Brandy, MD  HYDROcodone-acetaminophen (NORCO) 5-325 MG tablet Take 1-2 tablets by mouth every 6 (six) hours as needed for moderate pain or severe pain. 01/20/15   Fanny Skates, MD  levonorgestrel-ethinyl estradiol (JOLESSA) 0.15-0.03 MG tablet Take 1 tablet by mouth daily. 10/14/11   Earnstine Regal, PA-C  Multiple Vitamin (MULTIVITAMIN) tablet Take 1 tablet by mouth daily.    [provider]  pantoprazole (PROTONIX) 40 MG tablet Take 1 tablet (40 mg total) by mouth 2 (two) times daily. 11/18/14   Lafayette Dragon, MD  ranitidine (ZANTAC) 150 MG tablet Take 1 tablet (150 mg total) by mouth at bedtime. 11/18/14   Lafayette Dragon, MD      Allergies    Ceftin [cefuroxime axetil] and Penicillins    Review of Systems   Review of Systems  Constitutional:  Negative for fever.  HENT:  Negative for ear pain.   Eyes:  Negative for pain.  Respiratory:  Negative for cough.   Cardiovascular:  Negative  for chest pain.  Gastrointestinal:  Negative for abdominal pain.  Genitourinary:  Negative for flank pain.  Musculoskeletal:  Negative for back pain.  Skin:  Negative for rash.  Neurological:  Positive for dizziness and light-headedness. Negative for headaches.   Physical Exam Updated Vital Signs BP (!) 163/89   Pulse 67   Temp 98 F (36.7 C)   Resp 14   Ht '5\' 5"'$  (1.651 m)   Wt 59 kg   SpO2 100%   BMI 21.64 kg/m  Physical Exam Constitutional:      General: She is not in acute distress.    Appearance: Normal appearance.  HENT:     Head: Normocephalic.     Nose: Nose normal.  Eyes:     Extraocular Movements: Extraocular movements intact.  Cardiovascular:     Rate and Rhythm: Normal rate.  Pulmonary:     Effort: Pulmonary effort is normal.  Musculoskeletal:        General: Normal range of motion.     Cervical back: Normal range of motion.  Neurological:     General: No focal deficit present.     Mental Status: She is alert. Mental status is at baseline.     Cranial Nerves: No cranial nerve deficit.     Motor: No weakness.     Comments: Finger-nose and heel-to-shin intact.  Negative Romberg.     ED Results /  Procedures / Treatments   Labs (all labs ordered are listed, but only abnormal results are displayed) Labs Reviewed  COMPREHENSIVE METABOLIC PANEL - Abnormal; Notable for the following components:      Result Value   Glucose, Bld 104 (*)    All other components within normal limits  CBG MONITORING, ED - Abnormal; Notable for the following components:   Glucose-Capillary 104 (*)    All other components within normal limits  CBC  URINALYSIS, ROUTINE W REFLEX MICROSCOPIC  TROPONIN I (HIGH SENSITIVITY)    EKG EKG Interpretation  Date/Time:  Monday Sep 06 2021 11:59:08 EDT Ventricular Rate:  74 PR Interval:  160 QRS Duration: 78 QT Interval:  368 QTC Calculation: 408 R Axis:   81 Text Interpretation: Normal sinus rhythm Septal infarct , age undetermined  Abnormal ECG No previous ECGs available Confirmed by Thamas Jaegers ([id number]) on 09/06/2021 1:03:10 PM  Radiology CT Head Wo Contrast  Result Date: 09/06/2021 CLINICAL DATA:  Dizziness, non-specific unsteady gait EXAM: CT HEAD WITHOUT CONTRAST TECHNIQUE: Contiguous axial images were obtained from the base of the skull through the vertex without intravenous contrast. RADIATION DOSE REDUCTION: This exam was performed according to the departmental dose-optimization program which includes automated exposure control, adjustment of the mA and/or kV according to patient size and/or use of iterative reconstruction technique. COMPARISON:  None Available. FINDINGS: Brain: No evidence of acute infarction, hemorrhage, hydrocephalus, extra-axial collection or mass lesion/mass effect. Vascular: No hyperdense vessel identified. Skull: No acute fracture. Sinuses/Orbits: Clear visualized sinuses. No acute orbital findings. Other: No mastoid effusions. IMPRESSION: No evidence of acute intracranial abnormality. Electronically Signed   By: Margaretha Sheffield M.D.   On: 09/06/2021 12:38    Procedures Procedures    Medications Ordered in ED Medications  ondansetron (ZOFRAN) injection 4 mg (4 mg Intravenous Given 09/06/21 1225)  meclizine (ANTIVERT) tablet 25 mg (25 mg Oral Given 09/06/21 1225)    ED Course/ Medical Decision Making/ A&P                           Medical Decision Making Amount and/or Complexity of Data Reviewed Labs: ordered. Radiology: ordered.  Risk Prescription drug management.   Cardiac monitoring showing sinus rhythm.  Review of record shows outpatient visit January 22, 2021.  Diagnostic studies included labs CBC chemistry which were unremarkable CT scan of the brain is unremarkable as well.  Patient given Zofran and meclizine with significant improvement of symptoms, now able to ambulate without assistance.  Given her history and presentation I suspect vertigo, recommending outpatient  follow-up with ENT this week.  Recommending immediate return for worsening symptoms or additional concerns.  Recommended continued meclizine as needed at home.        Final Clinical Impression(s) / ED Diagnoses Final diagnoses:  Dizziness  Ear problems, right    Rx / DC Orders ED Discharge Orders          Ordered    meclizine (ANTIVERT) 25 MG tablet  3 times daily PRN        09/06/21 1352              Luna Fuse, MD 09/06/21 1352

## 2021-09-06 NOTE — ED Triage Notes (Signed)
Patient here POV from Home.  Endorses having a Loss of Balance at 1030 today while ambulating.   Currently endorses "Room-Spinning" Sensation and Nausea/Emesis.   Endorses taking Medications over the Past Week for Ear Pressure that her PCP has assessed.   No Diarrhea. No Pain. No Fevers.  NAD Noted during Triage. A&Ox4. GCS 15. BIB Wheelchair.

## 2021-09-06 NOTE — ED Notes (Signed)
Patient verbalizes understanding of discharge instructions. Opportunity for questioning and answers were provided. Patient discharged from ED.  °
